# Patient Record
Sex: Male | Born: 1960 | Hispanic: No | Marital: Married | Smoking: Never smoker
Health system: Southern US, Community
[De-identification: ages and names within clinical notes are randomized; demographics above are authoritative.]

## PROBLEM LIST (undated history)

## (undated) DIAGNOSIS — R55 Syncope and collapse: Secondary | ICD-10-CM

## (undated) DIAGNOSIS — R9431 Abnormal electrocardiogram [ECG] [EKG]: Secondary | ICD-10-CM

## (undated) DIAGNOSIS — R4020 Unspecified coma: Secondary | ICD-10-CM

## (undated) HISTORY — PX: ADENOIDECTOMY: SUR15

## (undated) HISTORY — DX: Syncope and collapse: R55

## (undated) HISTORY — DX: Unspecified coma: R40.20

## (undated) HISTORY — DX: Abnormal electrocardiogram (ECG) (EKG): R94.31

---

## 2017-02-19 ENCOUNTER — Emergency Department (HOSPITAL_COMMUNITY): Payer: BLUE CROSS/BLUE SHIELD

## 2017-02-19 ENCOUNTER — Encounter (HOSPITAL_COMMUNITY): Payer: Self-pay | Admitting: *Deleted

## 2017-02-19 ENCOUNTER — Emergency Department (HOSPITAL_COMMUNITY)
Admission: EM | Admit: 2017-02-19 | Discharge: 2017-02-19 | Disposition: A | Discharge status: Home / Self Care | Payer: BLUE CROSS/BLUE SHIELD | Attending: Emergency Medicine | Admitting: Emergency Medicine

## 2017-02-19 DIAGNOSIS — R9431 Abnormal electrocardiogram [ECG] [EKG]: Secondary | ICD-10-CM | POA: Insufficient documentation

## 2017-02-19 DIAGNOSIS — R4020 Unspecified coma: Secondary | ICD-10-CM

## 2017-02-19 DIAGNOSIS — R55 Syncope and collapse: Secondary | ICD-10-CM | POA: Insufficient documentation

## 2017-02-19 HISTORY — DX: Unspecified coma: R40.20

## 2017-02-19 LAB — BASIC METABOLIC PANEL
ANION GAP: 8 (ref 5–15)
BUN: 13 mg/dL (ref 6–20)
CO2: 23 mmol/L (ref 22–32)
Calcium: 9.2 mg/dL (ref 8.9–10.3)
Chloride: 104 mmol/L (ref 101–111)
Creatinine, Ser: 1.05 mg/dL (ref 0.61–1.24)
GFR calc Af Amer: >60 mL/min (ref >60)
GLUCOSE: 96 mg/dL (ref 65–99)
POTASSIUM: 3.9 mmol/L (ref 3.5–5.1)
SODIUM: 135 mmol/L (ref 135–145)

## 2017-02-19 LAB — CBC WITH DIFFERENTIAL/PLATELET
BASOS PCT: 0 %
Basophils Absolute: 0 10*3/uL (ref 0.0–0.1)
Eosinophils Absolute: 0 10*3/uL (ref 0.0–0.7)
Eosinophils Relative: 1 %
HEMATOCRIT: 43.1 % (ref 39.0–52.0)
HEMOGLOBIN: 14.3 g/dL (ref 13.0–17.0)
LYMPHS ABS: 1.2 10*3/uL (ref 0.7–4.0)
LYMPHS PCT: 28 %
MCH: 29.7 pg (ref 26.0–34.0)
MCHC: 33.2 g/dL (ref 30.0–36.0)
MCV: 89.6 fL (ref 78.0–100.0)
MONO ABS: 0.6 10*3/uL (ref 0.1–1.0)
MONOS PCT: 15 %
NEUTROS ABS: 2.3 10*3/uL (ref 1.7–7.7)
NEUTROS PCT: 56 %
Platelets: 169 10*3/uL (ref 150–400)
RBC: 4.81 MIL/uL (ref 4.22–5.81)
RDW: 12.6 % (ref 11.5–15.5)
WBC: 4.1 10*3/uL (ref 4.0–10.5)

## 2017-02-19 LAB — I-STAT TROPONIN, ED
Troponin i, poc: 0 ng/mL (ref 0.00–0.08)
Troponin i, poc: 0 ng/mL (ref 0.00–0.08)

## 2017-02-19 MED ORDER — SODIUM CHLORIDE 0.9 % IV BOLUS (SEPSIS)
1000.0000 mL | Freq: Once | INTRAVENOUS | Status: AC
  Administered 2017-02-19: 1000 mL via INTRAVENOUS

## 2017-02-19 NOTE — ED Notes (Signed)
Gave pt something to eat and drink per doctor.

## 2017-02-19 NOTE — Consult Note (Addendum)
The patient has been seen in conjunction with Robbie Lis, PA-C. All aspects of care have been considered and discussed. The patient has been personally interviewed, examined, and all clinical data has been reviewed.   56 year old with prior history of syncope at least twice in the past who for the preceding 24 hour set suffered a gastrointestinal illness with associated abdominal pain, nausea, and diarrhea. He had not been eating well because of the GI illness. Went to a [employer] today, the room was warm, she became diaphoretic, developed abdominal cramping, and had syncope.  Brought to the emergency room after the EKG was performed which revealed diffuse ST segment elevation.  The ECG personally interpreted personally interpreted reveals mild diffuse ST elevation predominantly in the anterior lateral precordial leads. No reciprocal changes noted. Pattern is most consistent with early repolarization.  Overall, I feel this patient has a history compatible with neurally mediated syncope. He has had more than one occasion. Today's episode seems to be precipitated by a gastrointestinal illness.  Plan is to get a delta troponin and if no evidence of myocardial ischemia, he could be discharged from the emergency room.  We will arrange for him to see electrophysiology team, specifically Dr. Sherryl Manges, to review his history and clinical data hopefully provide guidance that may be helpful for this young man in the future.  I did some teaching concerning response to prodrome which includes assuming a sitting or lying position as soon as possible, leg elevation and possible, and cold compresses to 4 head and neck. Long discussion with both the patient and wife.  Management/workup of the patient's underlying gastrointestinal acute illness is at the discretion of the emergency department physician.  Patient ID: Steven Bender MRN: [id number], DOB/AGE: Jun 13, 1961   Admit date:  02/19/2017  Reason for Consult: Syncope and Abnormal EKG Requesting Physician: Dr. Criss Alvine, Emergency Medicine    Primary Physician: No primary care provider on file. Primary Cardiologist: New (Dr. Katrinka Blazing)   Pt. Profile:  Steven Bender is a 56 y.o. male  who is being seen today, in the ED, for the evaluation of syncope and an abnormal EKG at the request of Dr. Criss Alvine, ED Physician.   Problem List  History reviewed. No pertinent past medical history.  Past Surgical History:  Procedure Laterality Date  . ADENOIDECTOMY       Allergies  Allergies  Allergen Reactions  . Beef-Derived Products Nausea And Vomiting    HPI  Steven Bender is a 56 y.o. male  who is being seen today, in the ED, for the evaluation of syncope and an abnormal EKG at the request of Dr. Criss Alvine, ED Physician.   He is a former resident of [place]. He now resides in [facility] and has not established care with a PCP since his move. He reports he has not been evaluated by a medical provider in over 2 years. He denies any h/o heart disease. No h/o HTN, HLD, DM nor tobacco use. He reports his father had "heart issues" late in life, but cannot recall the exact details.  The patient does report that he underwent w/u in [place] roughly 8 years ago for chest pain but w/u was normal. He had an exercise stress test. He recalls running on the treadmill for over 10 min w/o CP and without abnormal findings.   He also reports being told by a provider in [place] ~ 6 years ago that he had vasovagal syncope. Pt reports that he passed out shortly after injuring  his finger which caused significant pain.   Per pt report, he has had severe diarrhea for the past 2 days. No known sick contacts. He has had associated chills, generalized weakness, fatigue and occasional dizziness. He went to work today and was in a meeting. He reports the conference room was very hot. During the meeting, he started to feel unwell. Nauseated with abdominal cramping. He felt  faint. He was sitting in a chair and lowered his head and had a brief LOC. His coworkers report that he was out for just a couple of seconds. He felt a bit altered initially when he awoke. He denies any associated CP, dyspnea or palpitations. He also denies any recent h/o exertional CP or dyspnea. He is pretty active. He plays in the yard with his children. Yesterday, he was out throwing around a baseball with his son w/o limitation. He also walks and runs for exercise, w/o recent issues.   In ED, CBC is unremarkable. Normal WBC. BMP also unremarkable with normal SCr and K. He is afebrile. CXR is negative. No edema or consolidation. POC troponin is negative. EKG shows normal sinus rhythm w/ abnormal R-wave progression, early transition and mild diffuse ST elevations. However no prior EKG tracings for comparison. He does not recall ever being told about an abnormal EKG in the past. Orthostatics not checked. He is currently asymptomatic. He feels better. He is tolerating food. He just ate a chicken salad sandwich. No nausea. He feels comfortable going home, if cleared by MD.   Home Medications  Prior to Admission medications   Medication Sig Start Date End Date Taking? Authorizing Provider  ibuprofen (ADVIL,MOTRIN) 200 MG tablet Take 200-400 mg by mouth every 6 (six) hours as needed (for pain or inflammation).   Yes Historical Provider, MD     Family History  Family History  Problem Relation Age of Onset  . Heart disease Father     pt cannot recall exact details    Social History  Social History   Social History  . Marital status: Married    Spouse name: N/A  . Number of children: N/A  . Years of education: N/A   Occupational History  . Not on file.   Social History Main Topics  . Smoking status: Never Smoker  . Smokeless tobacco: Never Used  . Alcohol use Yes     Comment: occ  . Drug use: No  . Sexual activity: Not on file   Other Topics Concern  . Not on file   Social  History Narrative  . No narrative on file     Review of Systems General:  No chills, fever, night sweats or weight changes.  Cardiovascular:  No chest pain, dyspnea on exertion, edema, orthopnea, palpitations, paroxysmal nocturnal dyspnea. Dermatological: No rash, lesions/masses Respiratory: No cough, dyspnea Urologic: No hematuria, dysuria Abdominal:   No nausea, vomiting, diarrhea, bright red blood per rectum, melena, or hematemesis Neurologic:  No visual changes, wkns, changes in mental status. All other systems reviewed and are otherwise negative except as noted above.  Physical Exam  Blood pressure 126/73, pulse 82, temperature 98.8 F (37.1 C), temperature source Oral, resp. rate 19, height  (1.778 m), weight 180 lb (81.6 kg), SpO2 96 %.  General: Pleasant, NAD Psych: Normal affect. Neuro: Alert and oriented X 3. Moves all extremities spontaneously. HEENT: Normal  Neck: Supple without bruits or JVD. Lungs:  Resp regular and unlabored, CTA. Heart: RRR no s3, s4, or murmurs. Abdomen: Soft, non-tender,  non-distended, BS + x 4.  Extremities: No clubbing, cyanosis or edema. DP/PT/Radials 2+ and equal bilaterally.  Labs  Troponin [facility] Test)  Recent Labs  02/19/17 1257  TROPIPOC 0.00   No results for input(s): CKTOTAL, CKMB, TROPONINI in the last 72 hours. Lab Results  Component Value Date   WBC 4.1 02/19/2017   HGB 14.3 02/19/2017   HCT 43.1 02/19/2017   MCV 89.6 02/19/2017   PLT 169 02/19/2017     Recent Labs Lab 02/19/17 1245  NA 135  K 3.9  CL 104  CO2 23  BUN 13  CREATININE 1.05  CALCIUM 9.2  GLUCOSE 96   No results found for: CHOL, HDL, LDLCALC, TRIG No results found for: DDIMER   Radiology/Studies  Dg Chest Portable 1 View  Result Date: 02/19/2017 CLINICAL DATA:  Syncope EXAM: PORTABLE CHEST 1 VIEW COMPARISON:  None. FINDINGS: Lungs are clear. Heart size and pulmonary vascularity are within normal limits. No adenopathy. No evident  bone lesions. IMPRESSION: No edema or consolidation. Electronically Signed   By: Bretta Bang III M.D.   On: 02/19/2017 13:04    ECG  Normal sinus rhythm Abnormal R-wave progression, early transition diffuse ST elevations, without reciprocal change suggesting early repolarization versus pericarditis. Injury pattern is less likely No old tracing to compare   -- personally reviewed  Telemetry  NSR -- personally reviewed   ASSESSMENT AND PLAN  1. Syncope + Abnormal EKG: pt with prior h/o vasovagal syncope ~6 years ago, in the setting of severe pain from finger injury. His episode earlier today was in the setting of likely viral GI illness after 2 days of diarrhea. He had recurrent nausea and a bout of abdominal cramping, while sitting in a hot environment (very hot office space), when he briefly fainted at a work meeting. There was no associated CP, dyspnea or palpitations. He has no significant cardiac risk factors. Physical exam is benign w/o cardiac murmurs or carotid bruits. CBC and BMP both unremarkable. H/H normal. SCr/BUN WNL as well as K. His EKG shows slight diffuse ST elevations, however no prior EKGs for comparison. No anginal symptoms. Initial Troponin is negative. Recommend checking a 2nd delta troponin in the ED. If negative, it would be reasonable to d/c home and to continue w/u as an outpatient. We can consider outpatient 2D echo. Dr. Katrinka Blazing to assess and will provide definitive recommendations.    Signed, Robbie Lis, PA-C, MHS 02/19/2017, 4:24 PM [facility] Pager: [phone]

## 2017-02-19 NOTE — ED Provider Notes (Signed)
[facility]-EMERGENCY DEPT Provider Note   CSN: [id number] Arrival date & time: 02/19/17  1233     History   Chief Complaint Chief Complaint  Patient presents with  . Loss of Consciousness    HPI Steven Bender is a 56 y.o. male.  HPI  56 year old male with reportedly no medical problems presents with syncope. Brought in by EMS. Patient states that he has been sick over the last 3 days with diarrhea. He estimates he had 10 episodes yesterday. No blood in the stools. No fevers, chest pain, headache, vomiting. Patient states that he had to stay home from work yesterday due to the amount of diarrhea. Every time he would eat he would have diarrhea. Felt better today so he went to work. While at work he went to a meeting where everyone in the room thought that the heat was on too high. There was no water available. He progressively felt worse and worse and became lightheaded. He felt nauseated and had some peri-umbilical abdominal cramping. He put his head in his hands and was about asked for help when he relies that he had passed out briefly. Coworkers state it was very brief, no exact time given. There was never chest pain or shortness of breath or palpitations before, during, or after. Currently he feels fine, no nausea or abd pain. EMS noted ECG with anterior ST elevations. Patient denies a known coronary history. He states he has an ECG in [place] at [facility], when he had chest pain and a negative stress test. Otherwise no ECGs. No doctor in this area.  History reviewed. No pertinent past medical history.  There are no active problems to display for this patient.   Past Surgical History:  Procedure Laterality Date  . ADENOIDECTOMY         Home Medications    Prior to Admission medications   Medication Sig Start Date End Date Taking? Authorizing Provider  ibuprofen (ADVIL,MOTRIN) 200 MG tablet Take 200-400 mg by mouth every 6 (six) hours as needed (for pain or inflammation).   Yes  Historical Provider, MD    Family History No family history on file.  Social History Social History  Substance Use Topics  . Smoking status: Never Smoker  . Smokeless tobacco: Never Used  . Alcohol use Yes     Comment: occ     Allergies   Beef-derived products   Review of Systems Review of Systems  Constitutional: Negative for fever.  Respiratory: Negative for shortness of breath.   Cardiovascular: Negative for chest pain and palpitations.  Gastrointestinal: Positive for abdominal pain and nausea. Negative for vomiting.  Neurological: Positive for syncope and light-headedness.  All other systems reviewed and are negative.    Physical Exam Updated Vital Signs BP 126/73   Pulse 82   Temp 98.8 F (37.1 C) (Oral)   Resp 19   Ht  (1.778 m)   Wt 180 lb (81.6 kg)   SpO2 96%   BMI 25.83 kg/m   Physical Exam  Constitutional: He is oriented to person, place, and time. He appears well-developed and well-nourished. No distress.  HENT:  Head: Normocephalic and atraumatic.  Right Ear: External ear normal.  Left Ear: External ear normal.  Nose: Nose normal.  Eyes: Right eye exhibits no discharge. Left eye exhibits no discharge.  Neck: Neck supple.  Cardiovascular: Normal rate, regular rhythm and normal heart sounds.   Pulmonary/Chest: Effort normal and breath sounds normal.  Abdominal: Soft. There is no tenderness.  Musculoskeletal: He exhibits no edema.  Neurological: He is alert and oriented to person, place, and time.  Skin: Skin is warm and dry. He is not diaphoretic.  Nursing note and vitals reviewed.    ED Treatments / Results  Labs (all labs ordered are listed, but only abnormal results are displayed) Labs Reviewed  BASIC METABOLIC PANEL  CBC WITH DIFFERENTIAL/PLATELET  Rosezena Sensor, ED  I-STAT TROPOININ, ED    EKG  EKG Interpretation  Date/Time:  Wednesday February 19 2017 12:36:33 EDT Ventricular Rate:  73 PR Interval:    QRS  Duration: 95 QT Interval:  376 QTC Calculation: 415 R Axis:   65 Text Interpretation:  Normal sinus rhythm Abnormal R-wave progression, early transition diffuse ST elevations No old tracing to compare Confirmed by Fusako Tanabe MD, Cimone Fahey [phone]) on 02/19/2017 12:47:17 PM Also confirmed by Criss Alvine MD, Fantasha Daniele [phone]), editor [place] [address], [place] [phone])  on 02/19/2017 1:14:15 PM       EKG Interpretation  Date/Time:  Wednesday February 19 2017 13:26:37 EDT Ventricular Rate:  72 PR Interval:    QRS Duration: 99 QT Interval:  394 QTC Calculation: 432 R Axis:   64 Text Interpretation:  Sinus rhythm Minimal ST elevation, anterior leads ST elevation unchanged compared to earlier in the day, likely early repol Confirmed by Cong Hightower MD, Tywanna Seifer [phone]) on 02/19/2017 2:03:55 PM        Radiology Dg Chest Portable 1 View  Result Date: 02/19/2017 CLINICAL DATA:  Syncope EXAM: PORTABLE CHEST 1 VIEW COMPARISON:  None. FINDINGS: Lungs are clear. Heart size and pulmonary vascularity are within normal limits. No adenopathy. No evident bone lesions. IMPRESSION: No edema or consolidation. Electronically Signed   By: Bretta Bang III M.D.   On: 02/19/2017 13:04    Procedures Procedures (including critical care time)  Medications Ordered in ED Medications  sodium chloride 0.9 % bolus 1,000 mL (1,000 mLs Intravenous New Bag/Given 02/19/17 1248)     Initial Impression / Assessment and Plan / ED Course  I have reviewed the triage vital signs and the nursing notes.  Pertinent labs & imaging results that were available during my care of the patient were reviewed by me and considered in my medical decision making (see chart for details).  Clinical Course as of Feb 20 1543  Wed Feb 19, 2017  1245 I think this ST elevation is not ischemic, likely early repol. No CP/dyspnea symptoms. Fluids, consult cardiology, will try to get old ECG from [facility]  [SG]  1252 Dr Tresa Endo reviewed ECG. Feels this is most likely early repol.  Advises repeat ECG to make sure no progression. If no change, no further acute cardiac workup.  [SG]    Clinical Course User Index [SG] Pricilla Loveless, MD    Patient has remained stable. Discussed with patient/wife, will do 2nd troponin but my suspicion of ACS/STEMI is very low, especially with no other symptoms. This seems c/w syncope, especially with hot room and recent diarrheal illness. No complaints. Care to Dr. Rosalia Hammers with 2nd troponin pending  Final Clinical Impressions(s) / ED Diagnoses   Final diagnoses:  Neurocardiogenic syncope    New Prescriptions New Prescriptions   No medications on file     Pricilla Loveless, MD 02/19/17 1545

## 2017-02-19 NOTE — ED Provider Notes (Signed)
56 year old man with episode of syncope thought to be secondary to volume depletion. Patient had an abnormal EKG here. Troponin and repeat troponin are normal. Patient initially seen by Dr. Criss Alvine. Plan to discharge if troponin is normal. Cardiology called and consulted and arranged follow-up for patient. Repeat troponin is normal. Patient is stable for discharge.   Margarita Grizzle, MD 02/19/17 [phone]

## 2017-02-19 NOTE — ED Triage Notes (Signed)
Pt here via [facility] after syncopal episode of several seconds while sitting at a meeting.  EKG on scene showed st elevations.  Pt states diarrhea x several days and states room was hot.  He felt light-headed and passed out.  Given 324 asa and 4 zofran en-route.

## 2017-02-21 ENCOUNTER — Encounter: Payer: Self-pay | Admitting: Internal Medicine

## 2017-10-23 IMAGING — DX DG CHEST 1V PORT
1 series · 1 of 1 positions shown · non-contrast
Comparison: None.

CLINICAL DATA: Syncope

EXAM:
PORTABLE CHEST 1 VIEW

[chest ap]
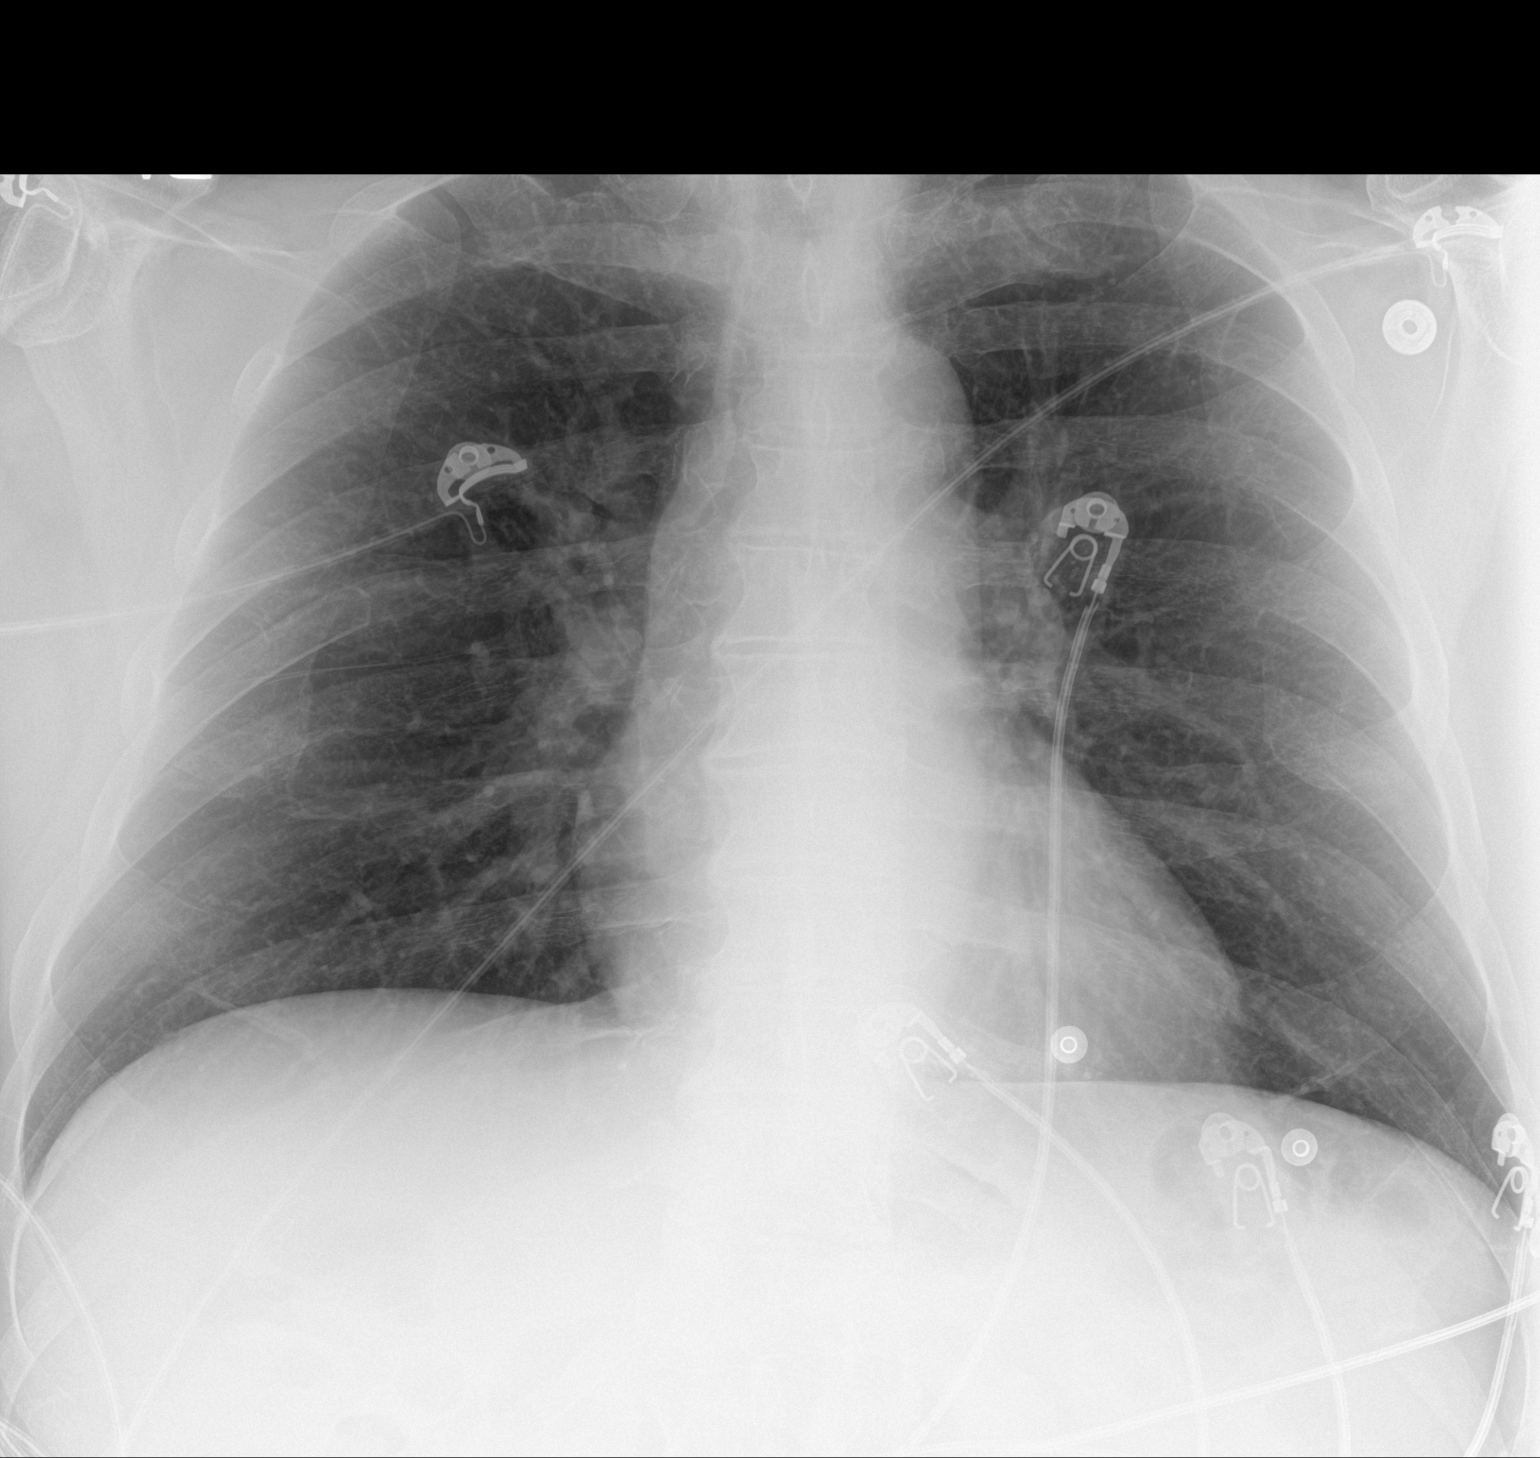

[1 of 1 positions shown; findings below may reference images not displayed]

FINDINGS: Lungs are clear. Heart size and pulmonary vascularity are within
normal limits. No adenopathy. No evident bone lesions.
IMPRESSION: No edema or consolidation.

## 2017-12-12 ENCOUNTER — Encounter: Payer: Self-pay | Admitting: Cardiology

## 2017-12-12 ENCOUNTER — Encounter (INDEPENDENT_AMBULATORY_CARE_PROVIDER_SITE_OTHER): Payer: Self-pay

## 2017-12-12 ENCOUNTER — Ambulatory Visit (INDEPENDENT_AMBULATORY_CARE_PROVIDER_SITE_OTHER): Payer: Commercial Managed Care - PPO | Admitting: Cardiology

## 2017-12-12 VITALS — BP 116/70 | HR 73 | Ht 70.0 in | Wt 191.0 lb

## 2017-12-12 DIAGNOSIS — R55 Syncope and collapse: Secondary | ICD-10-CM

## 2017-12-12 NOTE — Patient Instructions (Signed)
Medication Instructions:  Your physician recommends that you continue on your current medications as directed. Please refer to the Current Medication list given to you today.  * If you need a refill on your cardiac medications before your next appointment, please call your pharmacy.   Labwork: None ordered  Testing/Procedures: None ordered  Follow-Up: No follow up is needed at this time with Dr. Camnitz.  He will see you on an as needed basis.   Thank you for choosing [facility]!!   [name], RN [phone]     

## 2017-12-12 NOTE — Progress Notes (Signed)
Electrophysiology Office Note   Date:  12/12/2017   ID:  Steven Bender, DOB May 29, 1961, MRN [id number]  PCP:  Patient, No Pcp Per  Cardiologist:   Primary Electrophysiologist:  Will Jorja LoaMartin Camnitz, MD    Chief Complaint  Patient presents with  . Advice Only    Syncope     History of Present Illness: Steven Bender is a 57 y.o. male who is being seen today for the evaluation of syncope at the request of Pricilla LovelessScott Goldston. Presenting today for electrophysiology evaluation.  He presented to the emergency room in April 2018.  He is following up with cardiology for that visit.  He had diarrhea 2 days prior to the episode of syncope.  He was in a meeting when he felt unwell.  He was nauseous with abdominal cramping.  He also felt faint.  He had a brief loss of consciousness, per coworkers, for a few seconds.  He was altered initially when he awoke.  He had no chest pain, dyspnea, or palpitations.  His EKG showed sinus rhythm with abnormal R wave progression and mild diffuse ST elevations.  It was thought that he had an early repolarization pattern.  He does have a history of vasovagal syncope.  He is passed out or been near syncopal around the time of injuries.  Otherwise he has done well since his emergency room visit.  He has not passed out since that time.  Today, he denies symptoms of palpitations, chest pain, shortness of breath, orthopnea, PND, lower extremity edema, claudication, dizziness, presyncope, syncope, bleeding, or neurologic sequela. The patient is tolerating medications without difficulties.    Past Medical History:  Diagnosis Date  . Abnormal EKG   . Loss of consciousness (HCC) 02/19/2017  . Syncope and collapse    02/19/2017   Past Surgical History:  Procedure Laterality Date  . ADENOIDECTOMY       Current Outpatient Medications  Medication Sig Dispense Refill  . ibuprofen (ADVIL,MOTRIN) 200 MG tablet Take 200-400 mg by mouth every 6 (six) hours as needed (for pain  or inflammation).     No current facility-administered medications for this visit.     Allergies:   Beef-derived products   Social History:  The patient  reports that  has never smoked. he has never used smokeless tobacco. He reports that he drinks alcohol. He reports that he does not use drugs.   Family History:  The patient's family history includes Depression in his brother; Diabetes in his brother, brother, and sister; Heart disease in his brother and father; Hyperlipidemia in his mother; Stroke in his mother.    ROS:  Please see the history of present illness.   Otherwise, review of systems is positive for anxiety.   All other systems are reviewed and negative.    PHYSICAL EXAM: VS:  BP 116/70   Pulse 73   Ht 5\' 10"  (1.778 m)   Wt 191 lb (86.6 kg)   BMI 27.41 kg/m  , BMI Body mass index is 27.41 kg/m. GEN: Well nourished, well developed, in no acute distress  HEENT: normal  Neck: no JVD, carotid bruits, or masses Cardiac: RRR; no murmurs, rubs, or gallops,no edema  Respiratory:  clear to auscultation bilaterally, normal work of breathing GI: soft, nontender, nondistended, + BS MS: no deformity or atrophy  Skin: warm and dry Neuro:  Strength and sensation are intact Psych: euthymic mood, full affect  EKG:  EKG is ordered today. Personal review of the ekg ordered shows sinus  rhythm, early repolarization, rate 73  Recent Labs: 02/19/2017: BUN 13; Creatinine, Ser 1.05; Hemoglobin 14.3; Platelets 169; Potassium 3.9; Sodium 135    Lipid Panel  No results found for: CHOL, TRIG, HDL, CHOLHDL, VLDL, LDLCALC, LDLDIRECT   Wt Readings from Last 3 Encounters:  12/12/17 191 lb (86.6 kg)  02/19/17 180 lb (81.6 kg)      Other studies Reviewed: Additional studies/ records that were reviewed today include: Epic notes   ASSESSMENT AND PLAN:  1.  Syncope: Initially thought due to neurally mediated syncope.  Has had no further episodes since his initial syncope in April.  Had  been sick at the time.  It is possible this was a neurocardiogenic or vaguely mediated syncope episode.  As he has not had further episodes, no further workup is indicated.  He does have a history of vasovagal syncope, having passed out multiple times or been near syncopal with injuries.  It is possible that this was simply vasovagal.    Current medicines are reviewed at length with the patient today.   The patient does not have concerns regarding his medicines.  The following changes were made today:  none  Labs/ tests ordered today include:  Orders Placed This Encounter  Procedures  . EKG 12-Lead     Disposition:   FU with Will Camnitz as needed  Signed, Will Jorja Loa, MD  12/12/2017 10:16 AM     [facility] [address] [place] [address] [phone] (office) [phone] (fax)

## 2019-08-27 ENCOUNTER — Other Ambulatory Visit: Payer: Self-pay

## 2019-08-27 DIAGNOSIS — Z20822 Contact with and (suspected) exposure to covid-19: Secondary | ICD-10-CM

## 2019-08-28 LAB — NOVEL CORONAVIRUS, NAA: SARS-CoV-2, NAA: NOT DETECTED

## 2020-02-28 ENCOUNTER — Ambulatory Visit: Payer: Self-pay

## 2020-10-20 ENCOUNTER — Ambulatory Visit: Payer: Self-pay | Attending: Internal Medicine

## 2020-10-20 DIAGNOSIS — Z23 Encounter for immunization: Secondary | ICD-10-CM

## 2020-10-20 NOTE — Progress Notes (Signed)
° °  Covid-19 Vaccination Clinic  Name:  Steven Bender    MRN: [id number] DOB: 1961-07-06  10/20/2020  Mr. Ibarra was observed post Covid-19 immunization for 15 minutes without incident. He was provided with Vaccine Information Sheet and instruction to access the V-Safe system.   Mr. Sainsbury was instructed to call 911 with any severe reactions post vaccine:  Difficulty breathing   Swelling of face and throat   A fast heartbeat   A bad rash all over body   Dizziness and weakness   Immunizations Administered    No immunizations on file.

## 2021-09-27 ENCOUNTER — Ambulatory Visit: Payer: Self-pay | Attending: Internal Medicine

## 2021-09-27 ENCOUNTER — Other Ambulatory Visit (HOSPITAL_BASED_OUTPATIENT_CLINIC_OR_DEPARTMENT_OTHER): Payer: Self-pay

## 2021-09-27 DIAGNOSIS — Z23 Encounter for immunization: Secondary | ICD-10-CM

## 2021-09-27 MED ORDER — MODERNA COVID-19 BIVAL BOOSTER 50 MCG/0.5ML IM SUSP
INTRAMUSCULAR | 0 refills | Status: DC
  Filled 2021-09-27: qty 0.5, 1d supply, fill #0

## 2021-09-27 NOTE — Progress Notes (Signed)
   Covid-19 Vaccination Clinic  Name:  Steven Bender    MRN: [id number] DOB: 1961-02-03  09/27/2021  Mr. Haub was observed post Covid-19 immunization for 15 minutes without incident. He was provided with Vaccine Information Sheet and instruction to access the V-Safe system.   Mr. Knerr was instructed to call 911 with any severe reactions post vaccine: Difficulty breathing  Swelling of face and throat  A fast heartbeat  A bad rash all over body  Dizziness and weakness   Immunizations Administered     Name Date Dose VIS Date Route   Moderna Covid-19 vaccine Bivalent Booster 09/27/2021 10:40 AM 0.5 mL 06/30/2021 Intramuscular   Manufacturer: Moderna   Lot: [id number]   NDC: 03212-248-25

## 2022-06-19 ENCOUNTER — Ambulatory Visit (INDEPENDENT_AMBULATORY_CARE_PROVIDER_SITE_OTHER): Payer: Managed Care, Other (non HMO)

## 2022-06-19 ENCOUNTER — Ambulatory Visit (INDEPENDENT_AMBULATORY_CARE_PROVIDER_SITE_OTHER): Payer: Managed Care, Other (non HMO) | Admitting: Family Medicine

## 2022-06-19 DIAGNOSIS — L720 Epidermal cyst: Secondary | ICD-10-CM | POA: Insufficient documentation

## 2022-06-19 DIAGNOSIS — M25552 Pain in left hip: Secondary | ICD-10-CM

## 2022-06-19 DIAGNOSIS — R3915 Urgency of urination: Secondary | ICD-10-CM

## 2022-06-19 DIAGNOSIS — Z0000 Encounter for general adult medical examination without abnormal findings: Secondary | ICD-10-CM

## 2022-06-19 DIAGNOSIS — Z1211 Encounter for screening for malignant neoplasm of colon: Secondary | ICD-10-CM | POA: Diagnosis not present

## 2022-06-19 DIAGNOSIS — Z125 Encounter for screening for malignant neoplasm of prostate: Secondary | ICD-10-CM

## 2022-06-19 DIAGNOSIS — M25551 Pain in right hip: Secondary | ICD-10-CM

## 2022-06-19 DIAGNOSIS — R3129 Other microscopic hematuria: Secondary | ICD-10-CM

## 2022-06-19 LAB — POCT URINALYSIS DIPSTICK
Bilirubin, UA: NEGATIVE
Glucose, UA: NEGATIVE
Nitrite, UA: NEGATIVE
Protein, UA: NEGATIVE
Spec Grav, UA: 1.025 (ref 1.010–1.025)
Urobilinogen, UA: 0.2 E.U./dL
pH, UA: 5.5 (ref 5.0–8.0)

## 2022-06-19 NOTE — Patient Instructions (Signed)
  Medication Instructions:  Your physician recommends that you continue on your current medications as directed. Please refer to the Current Medication list given to you today. --If you need a refill on any your medications before your next appointment, please call your pharmacy first. If no refills are authorized on file call the office.-- Lab Work: Your physician has recommended that you have lab work today: Yes If you have labs (blood work) drawn today and your tests are completely normal, you will receive your results via MyChart message OR a phone call from our staff.  Please ensure you check your voicemail in the event that you authorized detailed messages to be left on a delegated number. If you have any lab test that is abnormal or we need to change your treatment, we will call you to review the results.  Referrals/Procedures/Imaging: X-Rays (Hip)  Follow-Up: Your next appointment:   Your physician recommends that you schedule a follow-up appointment in: 3-6 weeks cpe with Dr. de Peru.  You will receive a text message or e-mail with a link to a survey about your care and experience with [place] today! We would greatly appreciate your feedback!   Thanks for letting us be apart of your health journey!!  Primary Care and Sports Medicine   Dr. Ceasar Mons Peru   We encourage you to activate your patient portal called "MyChart".  Sign up information is provided on this After Visit Summary.  MyChart is used to connect with patients for Virtual Visits (Telemedicine).  Patients are able to view lab/test results, encounter notes, upcoming appointments, etc.  Non-urgent messages can be sent to your provider as well. To learn more about what you can do with MyChart, please visit --  [email or web address].

## 2022-06-19 NOTE — Progress Notes (Signed)
New Patient Office Visit  Subjective    Patient ID: Steven Bender, male    DOB: 07-24-1961  Age: 61 y.o. MRN: [id number]  CC:  Chief Complaint  Patient presents with   New Patient (Initial Visit)    Pt here to establish new care     HPI Steven Bender presents to establish care Last PCP - in [place], last visit was several years ago  Lump near breast - noted first about 3-4 months ago. Possibly slight increase in size since first noted. No drainage. No redness or pain.  Urinary urgency/intermittent discomfort near testicle/lower abdomen. Urgency has been present for about a couple months. Reports about once nightly awakening to urinate. No pain or burning with urination.  Reports that with his prior PCP, he was having intermittent prostate screening, had PSA completed in the past, never had any abnormal findings related to this.  Needs colon cancer screening -requesting to have this completed.  He does report bilateral hip pain, this has been a chronic issue for patient.  Has not had any prior evaluation or treatment done.  Pain does seem to affect both hips about equally.  Worsens with certain activities.  Patient is originally from [place] (was [employer]). Moved to the area in 2016. Patient works as Hydrographic surveyor, Herbalist. Outside of work, he plays tennis, golf, walking, spending time with family.  Outpatient Encounter Medications as of 06/19/2022  Medication Sig   [DISCONTINUED] COVID-19 mRNA bivalent vaccine, Moderna, (MODERNA COVID-19 BIVAL BOOSTER) 50 MCG/0.5ML injection Inject into the muscle.   [DISCONTINUED] ibuprofen (ADVIL,MOTRIN) 200 MG tablet Take 200-400 mg by mouth every 6 (six) hours as needed (for pain or inflammation).   No facility-administered encounter medications on file as of 06/19/2022.    Past Medical History:  Diagnosis Date   Abnormal EKG    Loss of consciousness (HCC) 02/19/2017   Syncope and collapse     02/19/2017    Past Surgical History:  Procedure Laterality Date   ADENOIDECTOMY      Family History  Problem Relation Age of Onset   Heart disease Father        pt cannot recall exact details   Hyperlipidemia Mother    Stroke Mother    Depression Brother    Heart disease Brother    Diabetes Brother    Diabetes Sister    Diabetes Brother     Social History   Socioeconomic History   Marital status: Married    Spouse name: Not on file   Number of children: Not on file   Years of education: Not on file   Highest education level: Not on file  Occupational History   Not on file  Tobacco Use   Smoking status: Never   Smokeless tobacco: Never  Substance and Sexual Activity   Alcohol use: Yes    Comment: occ   Drug use: No   Sexual activity: Not on file  Other Topics Concern   Not on file  Social History Narrative   Not on file   Social Determinants of Health   Financial Resource Strain: Not on file  Food Insecurity: Not on file  Transportation Needs: Not on file  Physical Activity: Not on file  Stress: Not on file  Social Connections: Not on file  Intimate Partner Violence: Not on file    Objective    BP (!) 152/97   Pulse 75   Ht 5\' 10"  (1.778 m)  Wt 194 lb 8 oz (88.2 kg)   SpO2 97%   BMI 27.91 kg/m   Physical Exam  61 year old male in no acute distress Cardiovascular exam with regular rate and rhythm, no murmur appreciated Lungs clear to auscultation bilaterally Abdomen with normal bowel sounds, mild tenderness to palpation in lower abdomen, no organomegaly, no rebound tenderness On exam of chest wall, there is a small nodule palpated.  There also appears to be a small keratin plug overlying this palpable area.  There is no tenderness to palpation, no surrounding erythema.  Assessment & Plan:   Problem List Items Addressed This Visit       Other   Urinary urgency    Uncertain etiology, could be related to BPH.  Has had prior prostate cancer  screening in the past, this has reportedly been unremarkable.  Last evaluation however was at least 7 to 8 years ago Today, we will proceed with initial labs, will also complete UA.  If UA with concern for possible infection, likely send for culture.  If blood detected, would anticipate referral to urology for further evaluation We will also check PSA which patient reports has been checked in the past and has been within normal limits previously did discuss limitations related to PSA in regards to prostate cancer screening specifically as it can be elevated due to other causes such as BPH      Relevant Orders   POCT urinalysis dipstick (Completed)   Urine Culture   Urinalysis, microscopic only   Bilateral hip pain    Chronic issue for patient, has not had prior evaluation completed Given chronicity of symptoms, will proceed with initial x-rays of bilateral hips.  Will also further assess at follow-up visit.  If symptoms felt to be primarily related to underlying osteoarthritis, considerations are for ultrasound-guided steroid injection, physical therapy, home exercises.      Relevant Orders   DG Hip Unilat W OR W/O Pelvis 2-3 Views Left (Completed)   DG Hip Unilat W OR W/O Pelvis 2-3 Views Right (Completed)   Epidermoid cyst    Area at inferior chest wall appears most consistent with an epidermoid cyst.  There is a small keratin plug overlying this area.  No concerning findings on exam, no concerns based on history.  Discussed possible interventions including excision of cyst, patient would prefer to avoid this for now.  Discussed that this can be considered in the future.  Can continue monitoring for any notable changes such as signs of irritation or inflammation or any changes in size of the lesion      Other Visit Diagnoses     Wellness examination       Relevant Orders   CBC with Differential/Platelet   Comprehensive metabolic panel   Hemoglobin A1c   Lipid panel   TSH Rfx on  Abnormal to Free T4   PSA Total (Reflex To Free)   Prostate cancer screening       Relevant Orders   PSA Total (Reflex To Free)   Colon cancer screening       Relevant Orders   Cologuard   Other microscopic hematuria       Relevant Orders   Urinalysis, microscopic only       Return in about 4 weeks (around 07/17/2022) for CPE.  Likely further discuss hips at that time  Lucynda Rosano J De [place], MD

## 2022-06-19 NOTE — Assessment & Plan Note (Addendum)
Uncertain etiology, could be related to BPH.  Has had prior prostate cancer screening in the past, this has reportedly been unremarkable.  Last evaluation however was at least 7 to 8 years ago Today, we will proceed with initial labs, will also complete UA.  If UA with concern for possible infection, likely send for culture.  If blood detected, would anticipate referral to urology for further evaluation We will also check PSA which patient reports has been checked in the past and has been within normal limits previously did discuss limitations related to PSA in regards to prostate cancer screening specifically as it can be elevated due to other causes such as BPH

## 2022-06-19 NOTE — Assessment & Plan Note (Signed)
Chronic issue for patient, has not had prior evaluation completed Given chronicity of symptoms, will proceed with initial x-rays of bilateral hips.  Will also further assess at follow-up visit.  If symptoms felt to be primarily related to underlying osteoarthritis, considerations are for ultrasound-guided steroid injection, physical therapy, home exercises.

## 2022-06-19 NOTE — Assessment & Plan Note (Signed)
Area at inferior chest wall appears most consistent with an epidermoid cyst.  There is a small keratin plug overlying this area.  No concerning findings on exam, no concerns based on history.  Discussed possible interventions including excision of cyst, patient would prefer to avoid this for now.  Discussed that this can be considered in the future.  Can continue monitoring for any notable changes such as signs of irritation or inflammation or any changes in size of the lesion

## 2022-06-20 LAB — LIPID PANEL
Chol/HDL Ratio: 5.1 ratio — ABNORMAL HIGH (ref 0.0–5.0)
Cholesterol, Total: 242 mg/dL — ABNORMAL HIGH (ref 100–199)
HDL: 47 mg/dL (ref >39)
LDL Chol Calc (NIH): 173 mg/dL — ABNORMAL HIGH (ref 0–99)
Triglycerides: 124 mg/dL (ref 0–149)
VLDL Cholesterol Cal: 22 mg/dL (ref 5–40)

## 2022-06-20 LAB — COMPREHENSIVE METABOLIC PANEL
ALT: 14 IU/L (ref 0–44)
AST: 19 IU/L (ref 0–40)
Albumin/Globulin Ratio: 1.6 (ref 1.2–2.2)
Albumin: 4.5 g/dL (ref 3.8–4.9)
Alkaline Phosphatase: 106 IU/L (ref 44–121)
BUN/Creatinine Ratio: 12 (ref 10–24)
BUN: 11 mg/dL (ref 8–27)
Bilirubin Total: 0.7 mg/dL (ref 0.0–1.2)
CO2: 22 mmol/L (ref 20–29)
Calcium: 10.3 mg/dL — ABNORMAL HIGH (ref 8.6–10.2)
Chloride: 101 mmol/L (ref 96–106)
Creatinine, Ser: 0.91 mg/dL (ref 0.76–1.27)
Globulin, Total: 2.9 g/dL (ref 1.5–4.5)
Glucose: 85 mg/dL (ref 70–99)
Potassium: 4.9 mmol/L (ref 3.5–5.2)
Sodium: 142 mmol/L (ref 134–144)
Total Protein: 7.4 g/dL (ref 6.0–8.5)
eGFR: 96 mL/min/{1.73_m2} (ref >59)

## 2022-06-20 LAB — CBC WITH DIFFERENTIAL/PLATELET
Basophils Absolute: 0.1 10*3/uL (ref 0.0–0.2)
Basos: 1 %
EOS (ABSOLUTE): 0.2 10*3/uL (ref 0.0–0.4)
Eos: 2 %
Hematocrit: 45.8 % (ref 37.5–51.0)
Hemoglobin: 15.8 g/dL (ref 13.0–17.7)
Immature Grans (Abs): 0 10*3/uL (ref 0.0–0.1)
Immature Granulocytes: 0 %
Lymphocytes Absolute: 1.6 10*3/uL (ref 0.7–3.1)
Lymphs: 24 %
MCH: 30.7 pg (ref 26.6–33.0)
MCHC: 34.5 g/dL (ref 31.5–35.7)
MCV: 89 fL (ref 79–97)
Monocytes Absolute: 0.6 10*3/uL (ref 0.1–0.9)
Monocytes: 8 %
Neutrophils Absolute: 4.5 10*3/uL (ref 1.4–7.0)
Neutrophils: 65 %
Platelets: 314 10*3/uL (ref 150–450)
RBC: 5.14 x10E6/uL (ref 4.14–5.80)
RDW: 11.7 % (ref 11.6–15.4)
WBC: 6.9 10*3/uL (ref 3.4–10.8)

## 2022-06-20 LAB — PSA TOTAL (REFLEX TO FREE): Prostate Specific Ag, Serum: 0.4 ng/mL (ref 0.0–4.0)

## 2022-06-20 LAB — HEMOGLOBIN A1C
Est. average glucose Bld gHb Est-mCnc: 111 mg/dL
Hgb A1c MFr Bld: 5.5 % (ref 4.8–5.6)

## 2022-06-20 LAB — TSH RFX ON ABNORMAL TO FREE T4: TSH: 1.94 u[IU]/mL (ref 0.450–4.500)

## 2022-06-21 LAB — URINE CULTURE

## 2022-07-11 ENCOUNTER — Encounter (HOSPITAL_BASED_OUTPATIENT_CLINIC_OR_DEPARTMENT_OTHER): Payer: Self-pay

## 2022-07-12 LAB — COLOGUARD: COLOGUARD: NEGATIVE

## 2022-07-25 ENCOUNTER — Encounter (HOSPITAL_BASED_OUTPATIENT_CLINIC_OR_DEPARTMENT_OTHER): Payer: Managed Care, Other (non HMO) | Admitting: Family Medicine

## 2022-11-08 ENCOUNTER — Other Ambulatory Visit (HOSPITAL_BASED_OUTPATIENT_CLINIC_OR_DEPARTMENT_OTHER): Payer: Self-pay

## 2022-11-08 MED ORDER — COMIRNATY 30 MCG/0.3ML IM SUSY
PREFILLED_SYRINGE | INTRAMUSCULAR | 0 refills | Status: DC
  Filled 2022-11-08: qty 0.3, 1d supply, fill #0

## 2022-12-16 ENCOUNTER — Encounter (HOSPITAL_BASED_OUTPATIENT_CLINIC_OR_DEPARTMENT_OTHER): Payer: Self-pay | Admitting: Family Medicine

## 2022-12-16 ENCOUNTER — Other Ambulatory Visit (HOSPITAL_BASED_OUTPATIENT_CLINIC_OR_DEPARTMENT_OTHER): Payer: Self-pay

## 2022-12-16 ENCOUNTER — Ambulatory Visit (INDEPENDENT_AMBULATORY_CARE_PROVIDER_SITE_OTHER): Payer: Managed Care, Other (non HMO) | Admitting: Family Medicine

## 2022-12-16 VITALS — BP 159/85 | HR 68 | Ht 70.0 in | Wt 203.7 lb

## 2022-12-16 DIAGNOSIS — L0291 Cutaneous abscess, unspecified: Secondary | ICD-10-CM

## 2022-12-16 MED ORDER — CEPHALEXIN 500 MG PO CAPS: 500.0000 mg | ORAL_CAPSULE | Freq: Two times a day (BID) | ORAL | 0 refills | Status: DC

## 2022-12-16 MED ORDER — SHINGRIX 50 MCG/0.5ML IM SUSR
INTRAMUSCULAR | 0 refills | Status: DC
  Filled 2022-12-16: qty 0.5, 1d supply, fill #0

## 2022-12-16 NOTE — Progress Notes (Signed)
   Established Patient Office Visit  Subjective   Patient ID: Steven Bender, male    DOB: 1961-10-13  Age: 62 y.o. MRN: [id number]  Chief Complaint  Patient presents with   Mass    Pt here for having a bump on his chest he stated, he noticed it a couple of months ago, started getting irritated and swelling up about 10 days ago, pus and blood     HPI Scabbed area under left breast, reports recent drainage of pus mixed with serous fluid after spontaneous rupture several days ago. Area has been  present for 6 months, worse now--  getting larger this past week. No fever or chills.  Reviewed picture patient had before spontaneously drained.  Reports applying warm compresses after drainage started.   Review of Systems  Constitutional:  Negative for chills and fever.  Respiratory:  Negative for shortness of breath.   Cardiovascular:  Negative for chest pain.  Gastrointestinal:  Negative for nausea and vomiting.  Skin:        Scabbed area under left breast.       Objective:     BP (!) 159/85 (BP Location: Right Arm, Patient Position: Sitting, Cuff Size: Normal)   Pulse 68   Ht 5\' 10"  (1.778 m)   Wt 203 lb 11.2 oz (92.4 kg)   SpO2 99%   BMI 29.23 kg/m    Physical Exam Vitals and nursing note reviewed.  Constitutional:      General: He is not in acute distress.    Appearance: Normal appearance. He is normal weight.  Cardiovascular:     Rate and Rhythm: Normal rate.  Pulmonary:     Effort: Pulmonary effort is normal.  Skin:    General: Skin is warm and dry.          Comments: 4 cm x 3 cm area scabbed under left breast with surrounding erythema. Indurated, not appropriate for further I&D today.   Neurological:     General: No focal deficit present.     Mental Status: He is alert. Mental status is at baseline.  Psychiatric:        Mood and Affect: Mood normal.        Behavior: Behavior normal.        Thought Content: Thought content normal.        Judgment: Judgment  normal.      No results found for any visits on 12/16/22.    The 10-year ASCVD risk score (Arnett DK, et al., 2019) is: 16.4%    Assessment & Plan:   Problem List Items Addressed This Visit     Abscess - Primary    Indurated area under left breast that spontaneously ruptured several days ago.  This area is scabbed with erythema surrounding the scab.  Reports spontaneous rupture with pus mixed with serous fluid.  Denies fever, chills.  Has been using warm compresses after spontaneous rupture. No indication for I&D today.  Will treat with Keflex 500 mg twice daily for 10 days.  He will follow-up if symptoms do not resolve with treatment for possible derm referral for definitve treatment.       Relevant Medications   cephALEXin (KEFLEX) 500 MG capsule  Blood pressure is elevated in office today, recommend home monitoring to ensure return to normal.  Agrees with plan of care discussed.  Questions answered.   Return if symptoms worsen or fail to improve, for abscess.   Chalmers Guest, FNP

## 2022-12-16 NOTE — Assessment & Plan Note (Addendum)
Indurated area under left breast that spontaneously ruptured several days ago.  This area is scabbed with erythema surrounding the scab.  Reports spontaneous rupture with pus mixed with serous fluid.  Denies fever, chills.  Has been using warm compresses after spontaneous rupture. No indication for I&D today.  Will treat with Keflex 500 mg twice daily for 10 days.  Steven Bender will follow-up if symptoms do not resolve with treatment for possible derm referral for definitve treatment.

## 2023-01-29 ENCOUNTER — Encounter (HOSPITAL_BASED_OUTPATIENT_CLINIC_OR_DEPARTMENT_OTHER): Payer: Self-pay

## 2024-04-19 NOTE — Progress Notes (Signed)
**Note Steven-Identified via Obfuscation**  [facility] [address] [place] [address] Phone: [phone] Subjective:   IBryan Bender, am serving as a scribe for Dr. Ronnell Coins.  I'm seeing this patient by the request  of:  Steven Peru, Alonza Jansky, MD  CC: left foot pain   Steven Bender  Steven Bender is a 63 y.o. male coming in with complaint of L foot pain. Patient states was playing tennis and experience pain out of no where. Pain is over lateral side of foot. Has been icing. Has nerve issue on 3rd and 4th toe.    Foot and ankle xray first please  Past Medical History:  Diagnosis Date   Abnormal EKG    Loss of consciousness (HCC) 02/19/2017   Syncope and collapse    02/19/2017   Past Surgical History:  Procedure Laterality Date   ADENOIDECTOMY     Social History   Socioeconomic History   Marital status: Married    Spouse name: Not on file   Number of children: Not on file   Years of education: Not on file   Highest education level: Not on file  Occupational History   Not on file  Tobacco Use   Smoking status: Never   Smokeless tobacco: Never  Substance and Sexual Activity   Alcohol use: Yes    Comment: occ   Drug use: No   Sexual activity: Not on file  Other Topics Concern   Not on file  Social History Narrative   Not on file   Social Drivers of Health   Financial Resource Strain: Not on file  Food Insecurity: Not on file  Transportation Needs: Not on file  Physical Activity: Not on file  Stress: Not on file  Social Connections: Not on file   Allergies  Allergen Reactions   Beef-Derived Drug Products Nausea And Vomiting   Family History  Problem Relation Age of Onset   Heart disease Father        pt cannot recall exact details   Hyperlipidemia Mother    Stroke Mother    Depression Brother    Heart disease Brother    Diabetes Brother    Diabetes Sister    Diabetes Brother      Current Outpatient Medications (Cardiovascular):     nitroGLYCERIN (NITRO-DUR) 0.2 mg/hr patch, Apply 1/4 of a patch to skin once daily.     Current Outpatient Medications (Other):    cephALEXin  (KEFLEX ) 500 MG capsule, Take 1 capsule (500 mg total) by mouth 2 (two) times daily.   Zoster Vaccine Adjuvanted (SHINGRIX ) injection, Inject into the muscle.   Reviewed prior external information including notes and imaging from  primary care provider As well as notes that were available from care everywhere and other healthcare systems.  Past medical history, social, surgical and family history all reviewed in electronic medical record.  No pertanent information unless stated regarding to the chief complaint.   Review of Systems:  No headache, visual changes, nausea, vomiting, diarrhea, constipation, dizziness, abdominal pain, skin rash, fevers, chills, night sweats, weight loss, swollen lymph nodes, body aches, joint swelling, chest pain, shortness of breath, mood changes. POSITIVE muscle aches  Objective  Blood pressure (!) 148/100, pulse 77, height 5\' 10"  (1.778 m), SpO2 97%.   General: No apparent distress alert and oriented x3 mood and affect normal, dressed appropriately.  HEENT: Pupils equal, extraocular movements intact  Respiratory: Patient's speak in full sentences and does not appear short of breath  Cardiovascular:  No lower extremity edema, non tender, no erythema  Foot exam shows that patient is mildly tender to palpation but not really over the fifth metatarsal.  Seems to be more proximal to this.  Seems to be more in the soft tissue.  No pain on the posterior aspect of the lateral malleolus.  Limited muscular skeletal ultrasound was performed and interpreted by Ronnell Coins, M  Limited ultrasound shows some hypoechoic changes that is consistent with a new acute tear of the peroneal tendon.  No significant retraction noted.  Increasing in neovascularization noted.  No cortical irregularity noted of the fifth  metatarsal. Impression: Peroneal tendon tear     Impression and Recommendations:     The above documentation has been reviewed and is accurate and complete Angline Schweigert M Tiran Sauseda, DO

## 2024-04-20 ENCOUNTER — Ambulatory Visit (INDEPENDENT_AMBULATORY_CARE_PROVIDER_SITE_OTHER): Admitting: Family Medicine

## 2024-04-20 ENCOUNTER — Encounter: Payer: Self-pay | Admitting: Family Medicine

## 2024-04-20 ENCOUNTER — Ambulatory Visit (INDEPENDENT_AMBULATORY_CARE_PROVIDER_SITE_OTHER)

## 2024-04-20 ENCOUNTER — Other Ambulatory Visit: Payer: Self-pay

## 2024-04-20 VITALS — BP 148/100 | HR 77 | Ht 70.0 in

## 2024-04-20 DIAGNOSIS — S86312A Strain of muscle(s) and tendon(s) of peroneal muscle group at lower leg level, left leg, initial encounter: Secondary | ICD-10-CM | POA: Insufficient documentation

## 2024-04-20 DIAGNOSIS — M79672 Pain in left foot: Secondary | ICD-10-CM

## 2024-04-20 MED ORDER — NITROGLYCERIN 0.2 MG/HR TD PT24: MEDICATED_PATCH | TRANSDERMAL | 0 refills | Status: DC

## 2024-04-20 MED ORDER — NITROGLYCERIN 0.2 MG/HR TD PT24: MEDICATED_PATCH | TRANSDERMAL | 1 refills | Status: DC

## 2024-04-20 NOTE — Patient Instructions (Signed)
 Good to see you  Peroneal tendon tear  Wear boot daily for 3 weeks Ok to come out of the boot when not weight bearing  Nitroglycerin Protocol   Apply 1/4 nitroglycerin patch to affected area daily.  Change position of patch within the affected area every 24 hours.  You may experience a headache during the first 1-2 weeks of using the patch, these should subside.  If you experience headaches after beginning nitroglycerin patch treatment, you may take your preferred over the counter pain reliever.  Another side effect of the nitroglycerin patch is skin irritation or rash related to patch adhesive.  Please notify our office if you develop more severe headaches or rash, and stop the patch.  Tendon healing with nitroglycerin patch may require 12 to 24 weeks depending on the extent of injury.  Men should not use if taking Viagra, Cialis, or Levitra.   Do not use if you have migraines or rosacea. See me again in 3 weeks (ok to double book)

## 2024-04-20 NOTE — Assessment & Plan Note (Signed)
 Peroneal tendon tear, starting nitroglycerin and warned of potential side effects.  Discussed CAM Walker, discussed icing regimen and home exercises.  Discussed when patient is not weightbearing to do range of motion exercises.  Discussed that I would like to see him again in 3 weeks to make sure we are seeing some healing aspect and will advance accordingly.  X-rays are still pending at this time.

## 2024-04-23 ENCOUNTER — Ambulatory Visit: Payer: Self-pay | Admitting: Family Medicine

## 2024-05-10 NOTE — Progress Notes (Unsigned)
 [facility] [address] [place] [address] Phone: [phone] Subjective:   Steven Bender, am serving as a scribe for Dr. Arthea Bender.  I'm seeing this patient by the request  of:  de [place], Steven Bender  CC: Ankle pain follow-up  YEP:Dlagzrupcz  04/20/2024 Peroneal tendon tear, starting nitroglycerin  and warned of potential side effects.  Discussed CAM Walker, discussed icing regimen and home exercises.  Discussed when patient is not weightbearing to do range of motion exercises.  Discussed that I would like to see him again in 3 weeks to make sure we are seeing some healing aspect and will advance accordingly.  X-rays are still pending at this time.      Update 05/13/2024 Steven Bender is a 63 y.o. male coming in with complaint of L foot pain.  Found to have more of a peroneal tendon injury.  Last seen 3 weeks ago.  Did have x-rays of the foot that showed some mild arthritis but otherwise fairly unremarkable.  Patient states that he is doing much better. Does have some discomfort over insertion of peroneal tendon. Walked 2 miles in boot without pain.       Past Medical History:  Diagnosis Date   Abnormal EKG    Loss of consciousness (HCC) 02/19/2017   Syncope and collapse    02/19/2017   Past Surgical History:  Procedure Laterality Date   ADENOIDECTOMY     Social History   Socioeconomic History   Marital status: Married    Spouse name: Not on file   Number of children: Not on file   Years of education: Not on file   Highest education level: Not on file  Occupational History   Not on file  Tobacco Use   Smoking status: Never   Smokeless tobacco: Never  Substance and Sexual Activity   Alcohol use: Yes    Comment: occ   Drug use: No   Sexual activity: Not on file  Other Topics Concern   Not on file  Social History Narrative   Not on file   Social Drivers of Health   Financial Resource Strain: Not on file  Food  Insecurity: Not on file  Transportation Needs: Not on file  Physical Activity: Not on file  Stress: Not on file  Social Connections: Not on file   Allergies  Allergen Reactions   Beef-Derived Drug Products Nausea And Vomiting   Family History  Problem Relation Age of Onset   Heart disease Father        pt cannot recall exact details   Hyperlipidemia Mother    Stroke Mother    Depression Brother    Heart disease Brother    Diabetes Brother    Diabetes Sister    Diabetes Brother      Current Outpatient Medications (Cardiovascular):    nitroGLYCERIN  (NITRO-DUR ) 0.2 mg/hr patch, Apply 1/4 of a patch to skin once daily.     Current Outpatient Medications (Other):    cephALEXin  (KEFLEX ) 500 MG capsule, Take 1 capsule (500 mg total) by mouth 2 (two) times daily.   Zoster Vaccine Adjuvanted (SHINGRIX ) injection, Inject into the muscle.   Reviewed prior external information including notes and imaging from  primary care provider As well as notes that were available from care everywhere and other healthcare systems.  Past medical history, social, surgical and family history all reviewed in electronic medical record.  No pertanent information unless stated regarding to the chief  complaint.   Review of Systems:  No headache, visual changes, nausea, vomiting, diarrhea, constipation, dizziness, abdominal pain, skin rash, fevers, chills, night sweats, weight loss, swollen lymph nodes, body aches, joint swelling, chest pain, shortness of breath, mood changes. POSITIVE muscle aches  Objective  Blood pressure 130/88, pulse 73, height 5' 10 (1.778 m), SpO2 98%.   General: No apparent distress alert and oriented x3 mood and affect normal, dressed appropriately.  HEENT: Pupils equal, extraocular movements intact  Respiratory: Patient's speak in full sentences and does not appear short of breath  Cardiovascular: No lower extremity edema, non tender, no erythema  Left ankle exam shows no  significant swelling.  Mild tenderness and just proximal to the fifth metatarsal proximally.  No significant swelling, no limited range of motion of the ankle at the moment.   Limited muscular skeletal ultrasound was performed and interpreted by Steven Bender, M  Limited ultrasound of patient's peroneal tendon shows that there is significant decrease in the hypoechoic changes that was noted previously.  Significant decrease in the dilatation of the tendon as well noted. Impression: Interval improvement   Impression and Recommendations:     The above documentation has been reviewed and is accurate and complete Steven Gracey M Hendrik Donath, DO

## 2024-05-13 ENCOUNTER — Ambulatory Visit: Admitting: Family Medicine

## 2024-05-13 ENCOUNTER — Encounter: Payer: Self-pay | Admitting: Family Medicine

## 2024-05-13 ENCOUNTER — Other Ambulatory Visit: Payer: Self-pay

## 2024-05-13 VITALS — BP 130/88 | HR 73 | Ht 70.0 in

## 2024-05-13 DIAGNOSIS — S86312A Strain of muscle(s) and tendon(s) of peroneal muscle group at lower leg level, left leg, initial encounter: Secondary | ICD-10-CM | POA: Diagnosis not present

## 2024-05-13 DIAGNOSIS — M79672 Pain in left foot: Secondary | ICD-10-CM

## 2024-05-13 NOTE — Assessment & Plan Note (Signed)
 Significant improvement noted at this time.  Has progressed to mild improvement, heel lift discussed, discussed icing regimen and home exercises, follow-up again in 6 to 8 weeks.  Given a splint for the ankle and to start to transition into more aggressive activity but avoid any type of stress on the peroneal tendon.

## 2024-05-13 NOTE — Patient Instructions (Signed)
 Air cast Heel lift in tennis shoes Ankle HEP start after 4th of July  [place] only at the wedding  Continue the nitro See me again in early August

## 2024-06-22 NOTE — Progress Notes (Unsigned)
 [facility] [address] [place] [address] Phone: [phone] Subjective:   Steven Bender, am serving as a scribe for Dr. Arthea Bender.  I'm seeing this patient by the request  of:  [place], Steven PARAS, MD  CC: Left foot pain  YEP:Steven Bender  05/13/2024 Significant improvement noted at this time.  Has progressed to mild improvement, heel lift discussed, discussed icing regimen and home exercises, follow-up again in 6 to 8 weeks.  Given a splint for the ankle and to start to transition into more aggressive activity but avoid any type of stress on the peroneal tendon      Update 06/24/2024 Steven Bender is a 63 y.o. male coming in with complaint of L foot pain. Patient states that he is doing better. Pain occurs when he walks without supportive shoes on hard surface. Pain is achy at times and ice resolves his symptoms.       Past Medical History:  Diagnosis Date   Abnormal EKG    Loss of consciousness (HCC) 02/19/2017   Syncope and collapse    02/19/2017   Past Surgical History:  Procedure Laterality Date   ADENOIDECTOMY     Social History   Socioeconomic History   Marital status: Married    Spouse name: Not on file   Number of children: Not on file   Years of education: Not on file   Highest education level: Not on file  Occupational History   Not on file  Tobacco Use   Smoking status: Never   Smokeless tobacco: Never  Substance and Sexual Activity   Alcohol use: Yes    Comment: occ   Drug use: No   Sexual activity: Not on file  Other Topics Concern   Not on file  Social History Narrative   Not on file   Social Drivers of Health   Financial Resource Strain: Not on file  Food Insecurity: Not on file  Transportation Needs: Not on file  Physical Activity: Not on file  Stress: Not on file  Social Connections: Not on file   Allergies  Allergen Reactions   Beef-Derived Drug Products Nausea And Vomiting   Family  History  Problem Relation Age of Onset   Heart disease Father        pt cannot recall exact details   Hyperlipidemia Mother    Stroke Mother    Depression Brother    Heart disease Brother    Diabetes Brother    Diabetes Sister    Diabetes Brother      Current Outpatient Medications (Cardiovascular):    nitroGLYCERIN  (NITRO-DUR ) 0.2 mg/hr patch, Apply 1/4 of a patch to skin once daily.     Current Outpatient Medications (Other):    cephALEXin  (KEFLEX ) 500 MG capsule, Take 1 capsule (500 mg total) by mouth 2 (two) times daily.   Zoster Vaccine Adjuvanted (SHINGRIX ) injection, Inject into the muscle.   Objective  Blood pressure (!) 142/90, pulse 70, height 5' 10 (1.778 m), weight 210 lb (95.3 kg), SpO2 98%.   General: No apparent distress alert and oriented x3 mood and affect normal, dressed appropriately.  HEENT: Pupils equal, extraocular movements intact  Respiratory: Patient's speak in full sentences and does not appear short of breath  Cardiovascular: No lower extremity edema, non tender, no erythema  Left ankle has significant improvement in range of motion.  Full strength noted.  No significant swelling noted at the moment.   Limited muscular skeletal ultrasound  was performed and interpreted by Steven Bender, M  Limited ultrasound shows patient peroneal tendon seems to be well-healed at this time.  Some mild scarring still noted but nothing severe.  Very minimal hypoechoic changes consistent with mild tendinopathy. Impression: Healed peroneal tendon   Impression and Recommendations:    The above documentation has been reviewed and is accurate and complete Steven Bender M Steven Lagman, DO

## 2024-06-24 ENCOUNTER — Other Ambulatory Visit: Payer: Self-pay

## 2024-06-24 ENCOUNTER — Ambulatory Visit: Admitting: Family Medicine

## 2024-06-24 ENCOUNTER — Encounter: Payer: Self-pay | Admitting: Family Medicine

## 2024-06-24 VITALS — BP 142/90 | HR 70 | Ht 70.0 in | Wt 210.0 lb

## 2024-06-24 DIAGNOSIS — S86312A Strain of muscle(s) and tendon(s) of peroneal muscle group at lower leg level, left leg, initial encounter: Secondary | ICD-10-CM

## 2024-06-24 DIAGNOSIS — M79672 Pain in left foot: Secondary | ICD-10-CM

## 2024-06-24 NOTE — Assessment & Plan Note (Signed)
 Hernia tendon tear seems to be completely healed with some scar tissue formation noted at this time.  Will start increasing activity extremely slowly.  I am anticipate the patient doing well.  Discussed icing after increasing activity such as golf and tennis.  Follow-up with me as needed

## 2024-09-10 ENCOUNTER — Observation Stay (HOSPITAL_COMMUNITY)

## 2024-09-10 ENCOUNTER — Encounter (HOSPITAL_COMMUNITY): Payer: Self-pay | Admitting: Internal Medicine

## 2024-09-10 ENCOUNTER — Emergency Department (HOSPITAL_COMMUNITY)

## 2024-09-10 ENCOUNTER — Other Ambulatory Visit: Payer: Self-pay

## 2024-09-10 ENCOUNTER — Ambulatory Visit: Payer: Self-pay

## 2024-09-10 ENCOUNTER — Observation Stay (HOSPITAL_COMMUNITY)
Admission: EM | Admit: 2024-09-10 | Discharge: 2024-09-11 | Disposition: A | Discharge status: Home / Self Care | Attending: Internal Medicine | Admitting: Internal Medicine

## 2024-09-10 DIAGNOSIS — I639 Cerebral infarction, unspecified: Secondary | ICD-10-CM | POA: Diagnosis present

## 2024-09-10 DIAGNOSIS — Z79899 Other long term (current) drug therapy: Secondary | ICD-10-CM | POA: Diagnosis not present

## 2024-09-10 DIAGNOSIS — I6389 Other cerebral infarction: Principal | ICD-10-CM | POA: Insufficient documentation

## 2024-09-10 DIAGNOSIS — I63311 Cerebral infarction due to thrombosis of right middle cerebral artery: Secondary | ICD-10-CM

## 2024-09-10 DIAGNOSIS — Z23 Encounter for immunization: Secondary | ICD-10-CM | POA: Diagnosis not present

## 2024-09-10 DIAGNOSIS — I10 Essential (primary) hypertension: Secondary | ICD-10-CM | POA: Insufficient documentation

## 2024-09-10 DIAGNOSIS — R531 Weakness: Principal | ICD-10-CM

## 2024-09-10 DIAGNOSIS — I700 Atherosclerosis of aorta: Secondary | ICD-10-CM | POA: Insufficient documentation

## 2024-09-10 DIAGNOSIS — G459 Transient cerebral ischemic attack, unspecified: Secondary | ICD-10-CM

## 2024-09-10 DIAGNOSIS — R030 Elevated blood-pressure reading, without diagnosis of hypertension: Secondary | ICD-10-CM | POA: Diagnosis present

## 2024-09-10 DIAGNOSIS — R29700 NIHSS score 0: Secondary | ICD-10-CM

## 2024-09-10 DIAGNOSIS — E785 Hyperlipidemia, unspecified: Secondary | ICD-10-CM | POA: Diagnosis present

## 2024-09-10 DIAGNOSIS — F10920 Alcohol use, unspecified with intoxication, uncomplicated: Secondary | ICD-10-CM | POA: Diagnosis not present

## 2024-09-10 DIAGNOSIS — R42 Dizziness and giddiness: Secondary | ICD-10-CM | POA: Diagnosis present

## 2024-09-10 LAB — DIFFERENTIAL
Abs Immature Granulocytes: 0.03 K/uL (ref 0.00–0.07)
Basophils Absolute: 0.1 K/uL (ref 0.0–0.1)
Basophils Relative: 1 %
Eosinophils Absolute: 0.2 K/uL (ref 0.0–0.5)
Eosinophils Relative: 2 %
Immature Granulocytes: 0 %
Lymphocytes Relative: 22 %
Lymphs Abs: 1.8 K/uL (ref 0.7–4.0)
Monocytes Absolute: 0.6 K/uL (ref 0.1–1.0)
Monocytes Relative: 8 %
Neutro Abs: 5.5 K/uL (ref 1.7–7.7)
Neutrophils Relative %: 67 %

## 2024-09-10 LAB — CBC
HCT: 50.6 % (ref 39.0–52.0)
Hemoglobin: 16.5 g/dL (ref 13.0–17.0)
MCH: 30.2 pg (ref 26.0–34.0)
MCHC: 32.6 g/dL (ref 30.0–36.0)
MCV: 92.5 fL (ref 80.0–100.0)
Platelets: 261 K/uL (ref 150–400)
RBC: 5.47 MIL/uL (ref 4.22–5.81)
RDW: 12 % (ref 11.5–15.5)
WBC: 8.2 K/uL (ref 4.0–10.5)
nRBC: 0 % (ref 0.0–0.2)

## 2024-09-10 LAB — COMPREHENSIVE METABOLIC PANEL WITH GFR
ALT: 20 U/L (ref 0–44)
AST: 24 U/L (ref 15–41)
Albumin: 4.3 g/dL (ref 3.5–5.0)
Alkaline Phosphatase: 77 U/L (ref 38–126)
Anion gap: 10 (ref 5–15)
BUN: 11 mg/dL (ref 8–23)
CO2: 25 mmol/L (ref 22–32)
Calcium: 9.6 mg/dL (ref 8.9–10.3)
Chloride: 102 mmol/L (ref 98–111)
Creatinine, Ser: 0.95 mg/dL (ref 0.61–1.24)
GFR, Estimated: >60 mL/min (ref >60)
Glucose, Bld: 123 mg/dL — ABNORMAL HIGH (ref 70–99)
Potassium: 4.3 mmol/L (ref 3.5–5.1)
Sodium: 137 mmol/L (ref 135–145)
Total Bilirubin: 0.8 mg/dL (ref 0.0–1.2)
Total Protein: 7.9 g/dL (ref 6.5–8.1)

## 2024-09-10 LAB — I-STAT CHEM 8, ED
BUN: 14 mg/dL (ref 8–23)
Calcium, Ion: 1.23 mmol/L (ref 1.15–1.40)
Chloride: 102 mmol/L (ref 98–111)
Creatinine, Ser: 0.9 mg/dL (ref 0.61–1.24)
Glucose, Bld: 121 mg/dL — ABNORMAL HIGH (ref 70–99)
HCT: 52 % (ref 39.0–52.0)
Hemoglobin: 17.7 g/dL — ABNORMAL HIGH (ref 13.0–17.0)
Potassium: 4.4 mmol/L (ref 3.5–5.1)
Sodium: 140 mmol/L (ref 135–145)
TCO2: 27 mmol/L (ref 22–32)

## 2024-09-10 LAB — LIPID PANEL
Cholesterol: 247 mg/dL — ABNORMAL HIGH (ref 0–200)
HDL: 50 mg/dL (ref >40)
LDL Cholesterol: 173 mg/dL — ABNORMAL HIGH (ref 0–99)
Total CHOL/HDL Ratio: 4.9 ratio
Triglycerides: 119 mg/dL (ref <150)
VLDL: 24 mg/dL (ref 0–40)

## 2024-09-10 LAB — ECHOCARDIOGRAM COMPLETE
AR max vel: 3.14 cm2
AV Area VTI: 3.18 cm2
AV Area mean vel: 3.22 cm2
AV Mean grad: 2 mmHg
AV Peak grad: 5.6 mmHg
Ao pk vel: 1.18 m/s
Area-P 1/2: 2.93 cm2
Calc EF: 57.4 %
Height: 70 in
MV VTI: 3.17 cm2
S' Lateral: 3.3 cm
Single Plane A2C EF: 56.4 %
Single Plane A4C EF: 57.2 %
Weight: 3322.77 [oz_av]

## 2024-09-10 LAB — PROTIME-INR
INR: 1 (ref 0.8–1.2)
Prothrombin Time: 14 s (ref 11.4–15.2)

## 2024-09-10 LAB — CBG MONITORING, ED: Glucose-Capillary: 125 mg/dL — ABNORMAL HIGH (ref 70–99)

## 2024-09-10 LAB — ETHANOL: Alcohol, Ethyl (B): <15 mg/dL (ref <15)

## 2024-09-10 LAB — HEMOGLOBIN A1C
Hgb A1c MFr Bld: 5.3 % (ref 4.8–5.6)
Mean Plasma Glucose: 105.41 mg/dL

## 2024-09-10 LAB — APTT: aPTT: 24 s (ref 24–36)

## 2024-09-10 LAB — HIV ANTIBODY (ROUTINE TESTING W REFLEX): HIV Screen 4th Generation wRfx: NONREACTIVE

## 2024-09-10 MED ORDER — ENOXAPARIN SODIUM 40 MG/0.4ML IJ SOSY
40.0000 mg | PREFILLED_SYRINGE | INTRAMUSCULAR | Status: DC
  Administered 2024-09-10: 40 mg via SUBCUTANEOUS
  Filled 2024-09-10: qty 0.4

## 2024-09-10 MED ORDER — ASPIRIN 325 MG PO TABS
325.0000 mg | ORAL_TABLET | Freq: Once | ORAL | Status: AC
  Administered 2024-09-10: 325 mg via ORAL
  Filled 2024-09-10: qty 1

## 2024-09-10 MED ORDER — ATORVASTATIN CALCIUM 80 MG PO TABS
80.0000 mg | ORAL_TABLET | Freq: Every evening | ORAL | Status: DC
  Administered 2024-09-10 – 2024-09-11 (×2): 80 mg via ORAL
  Filled 2024-09-10 (×2): qty 1

## 2024-09-10 MED ORDER — INFLUENZA VIRUS VACC SPLIT PF (FLUZONE) 0.5 ML IM SUSY
0.5000 mL | PREFILLED_SYRINGE | INTRAMUSCULAR | Status: AC
  Administered 2024-09-11: 0.5 mL via INTRAMUSCULAR
  Filled 2024-09-10: qty 0.5

## 2024-09-10 MED ORDER — ALBUTEROL SULFATE (2.5 MG/3ML) 0.083% IN NEBU
2.5000 mg | INHALATION_SOLUTION | Freq: Four times a day (QID) | RESPIRATORY_TRACT | Status: DC
  Filled 2024-09-10 (×2): qty 3

## 2024-09-10 MED ORDER — ACETAMINOPHEN 325 MG PO TABS: 650.0000 mg | ORAL_TABLET | Freq: Four times a day (QID) | ORAL | Status: DC | PRN

## 2024-09-10 MED ORDER — HYDRALAZINE HCL 20 MG/ML IJ SOLN: 10.0000 mg | INTRAMUSCULAR | Status: DC | PRN

## 2024-09-10 MED ORDER — IOHEXOL 350 MG/ML SOLN
75.0000 mL | Freq: Once | INTRAVENOUS | Status: AC | PRN
Start: 2024-09-10 — End: 2024-09-10
  Administered 2024-09-10: 75 mL via INTRAVENOUS

## 2024-09-10 MED ORDER — CLOPIDOGREL BISULFATE 300 MG PO TABS
300.0000 mg | ORAL_TABLET | Freq: Once | ORAL | Status: AC
  Administered 2024-09-10: 300 mg via ORAL
  Filled 2024-09-10: qty 1

## 2024-09-10 MED ORDER — ASPIRIN 81 MG PO TBEC
81.0000 mg | DELAYED_RELEASE_TABLET | Freq: Every day | ORAL | Status: DC
  Administered 2024-09-11: 81 mg via ORAL
  Filled 2024-09-10: qty 1

## 2024-09-10 MED ORDER — SODIUM CHLORIDE 0.9% FLUSH
3.0000 mL | Freq: Two times a day (BID) | INTRAVENOUS | Status: DC
  Administered 2024-09-10 – 2024-09-11 (×2): 3 mL via INTRAVENOUS

## 2024-09-10 MED ORDER — SODIUM CHLORIDE 0.9% FLUSH
3.0000 mL | Freq: Once | INTRAVENOUS | Status: AC
  Administered 2024-09-10: 3 mL via INTRAVENOUS

## 2024-09-10 MED ORDER — CLOPIDOGREL BISULFATE 75 MG PO TABS
75.0000 mg | ORAL_TABLET | Freq: Every day | ORAL | Status: DC
  Administered 2024-09-11: 75 mg via ORAL
  Filled 2024-09-10: qty 1

## 2024-09-10 MED ORDER — STROKE: EARLY STAGES OF RECOVERY BOOK
Freq: Once | Status: AC
  Filled 2024-09-10: qty 1

## 2024-09-10 MED ORDER — ONDANSETRON HCL 4 MG/2ML IJ SOLN: 4.0000 mg | Freq: Four times a day (QID) | INTRAMUSCULAR | Status: DC | PRN

## 2024-09-10 MED ORDER — ACETAMINOPHEN 650 MG RE SUPP: 650.0000 mg | Freq: Four times a day (QID) | RECTAL | Status: DC | PRN

## 2024-09-10 MED ORDER — ONDANSETRON HCL 4 MG PO TABS: 4.0000 mg | ORAL_TABLET | Freq: Four times a day (QID) | ORAL | Status: DC | PRN

## 2024-09-10 NOTE — ED Triage Notes (Signed)
 PT presents with Left leg and arm weakness and dizziness. LKW 0630. Pt observed walking in from car with altered gait.

## 2024-09-10 NOTE — Consult Note (Signed)
 NEUROLOGY CONSULT NOTE   Date of service: September 10, 2024 Patient Name: Steven Bender MRN:  [id number] DOB:  [birth year] Chief Complaint: L sided weakness off balance Requesting Provider: Levander Houston, MD  History of Present Illness  Steven Bender is a 63 y.o. male with hx of syncope, HTN, who presents with sudden onset L sided weakness and off balance. He woke up very early today. At 630, went downstairs to turn on the water. Felt like his left leg was weak. Leg kept hitting the stair treads. It was difficult  to control and move his L leg. Talked to his wife around 0930 and was brought in to the ED. Symptoms were mild on arrival to the ED. A code stroke was activated.  Symptoms had resolved prior to imaging.  LKW: 0630 Modified rankin score: 0-Completely asymptomatic and back to baseline post- stroke IV Thrombolysis: not offered, due to complete resolution of symptoms. EVT: not offered, not consistent with LVO and symptoms had completely resolved.  NIHSS components Score: Comment  1a Level of Conscious 0[]  1[]  2[]  3[]      1b LOC Questions 0[]  1[]  2[]       1c LOC Commands 0[]  1[]  2[]       2 Best Gaze 0[]  1[]  2[]       3 Visual 0[]  1[]  2[]  3[]      4 Facial Palsy 0[]  1[]  2[]  3[]      5a Motor Arm - left 0[]  1[]  2[]  3[]  4[]  UN[]    5b Motor Arm - Right 0[]  1[]  2[]  3[]  4[]  UN[]    6a Motor Leg - Left 0[]  1[]  2[]  3[]  4[]  UN[]    6b Motor Leg - Right 0[]  1[]  2[]  3[]  4[]  UN[]    7 Limb Ataxia 0[]  1[]  2[]  UN[]      8 Sensory 0[]  1[]  2[]  UN[]      9 Best Language 0[]  1[]  2[]  3[]      10 Dysarthria 0[]  1[]  2[]  UN[]      11 Extinct. and Inattention 0[]  1[]  2[]       TOTAL: 0      ROS  Comprehensive ROS performed and pertinent positives documented in HPI   Past History   Past Medical History:  Diagnosis Date   Abnormal EKG    Loss of consciousness (HCC) 02/19/2017   Syncope and collapse    02/19/2017    Past Surgical History:  Procedure Laterality Date   ADENOIDECTOMY       Family History: Family History  Problem Relation Age of Onset   Heart disease Father        pt cannot recall exact details   Hyperlipidemia Mother    Stroke Mother    Depression Brother    Heart disease Brother    Diabetes Brother    Diabetes Sister    Diabetes Brother     Social History  reports that he has never smoked. He has never used smokeless tobacco. He reports current alcohol use. He reports that he does not use drugs.  Allergies  Allergen Reactions   Bovine (Beef) Protein-Containing Drug Products Nausea And Vomiting    Medications  No current facility-administered medications for this encounter.  Current Outpatient Medications:    cephALEXin  (KEFLEX ) 500 MG capsule, Take 1 capsule (500 mg total) by mouth 2 (two) times daily., Disp: 20 capsule, Rfl: 0   nitroGLYCERIN  (NITRO-DUR ) 0.2 mg/hr patch, Apply 1/4 of a patch to skin once daily., Disp: 30 patch, Rfl: 0   Zoster Vaccine Adjuvanted (SHINGRIX ) injection, Inject into  the muscle., Disp: 0.5 mL, Rfl: 0  Vitals   Vitals:   10/09/24 1010 2024-10-09 1029  BP: (!) 201/84   Pulse: 71   Resp: 19   Temp: 97.6 F (36.4 C)   SpO2: 97%   Weight: 94.2 kg 94.2 kg  Height:  5' 10 (1.778 m)    Body mass index is 29.8 kg/m.   Physical Exam   General: Laying comfortably in bed; in no acute distress.  HENT: Normal oropharynx and mucosa. Normal external appearance of ears and nose.  Neck: Supple, no pain or tenderness  CV: No JVD. No peripheral edema.  Pulmonary: Symmetric Chest rise. Normal respiratory effort.  Abdomen: Soft to touch, non-tender.  Ext: No cyanosis, edema, or deformity  Skin: No rash. Normal palpation of skin.   Musculoskeletal: Normal digits and nails by inspection. No clubbing.   Neurologic Examination  Mental status/Cognition: Alert, oriented to self, place, month and year, good attention.  Speech/language: Fluent, comprehension intact, object naming intact, repetition intact.  Cranial  nerves:   CN II Pupils equal and reactive to light, no VF deficits    CN III,IV,VI EOM intact, no gaze preference or deviation, no nystagmus    CN V normal sensation in V1, V2, and V3 segments bilaterally    CN VII no asymmetry, no nasolabial fold flattening    CN VIII normal hearing to speech    CN IX & X normal palatal elevation, no uvular deviation    CN XI 5/5 head turn and 5/5 shoulder shrug bilaterally    CN XII midline tongue protrusion    Motor:  Muscle bulk: normal, tone normal, pronator drift none tremor none Mvmt Root Nerve  Muscle Right Left Comments  SA C5/6 Ax Deltoid 5 5   EF C5/6 Mc Biceps 5 5   EE C6/7/8 Rad Triceps 5 5   WF C6/7 Med FCR     WE C7/8 PIN ECU     F Ab C8/T1 U ADM/FDI 5 5   HF L1/2/3 Fem Illopsoas 5 5   KE L2/3/4 Fem Quad 5 5   DF L4/5 D Peron Tib Ant 5 5   PF S1/2 Tibial Grc/Sol 5 5    Sensation:  Light touch Intact throughout   Pin prick    Temperature    Vibration   Proprioception    Coordination/Complex Motor:  - Finger to Nose intact BL - Heel to shin intact BL - Rapid alternating movement are normal - Gait: deferred.  Labs/Imaging/Neurodiagnostic studies   CBC:  Recent Labs  Lab 09-Oct-2024 1023  WBC 8.2  NEUTROABS 5.5  HGB 16.5  17.7*  HCT 50.6  52.0  MCV 92.5  PLT 261   Basic Metabolic Panel:  Lab Results  Component Value Date   NA 140 10-09-2024   K 4.4 Oct 09, 2024   CO2 22 06/19/2022   GLUCOSE 121 (H) 2024/10/09   BUN 14 2024-10-09   CREATININE 0.90 10/09/24   CALCIUM 10.3 (H) 06/19/2022   GFRNONAA >60 02/19/2017   GFRAA >60 02/19/2017   Lipid Panel:  Lab Results  Component Value Date   LDLCALC 173 (H) 06/19/2022   HgbA1c:  Lab Results  Component Value Date   HGBA1C 5.5 06/19/2022   Urine Drug Screen: No results found for: LABOPIA, COCAINSCRNUR, LABBENZ, AMPHETMU, THCU, LABBARB  Alcohol Level No results found for: [facility] INR  Lab Results  Component Value Date   INR 1.0 09-Oct-2024   APTT  No results found for: APTT AED  levels: No results found for: PHENYTOIN, ZONISAMIDE, LAMOTRIGINE, LEVETIRACETA  CT Head without contrast(Personally reviewed): CTH was negative for a large hypodensity concerning for a large territory infarct or hyperdensity concerning for an ICH  CT angio Head and Neck with contrast: Pending  MRI Brain: pending   ASSESSMENT   ARLOW SPIERS is a 63 y.o. male with hx of of HTN who presents with episode of L sided weakness and clumsiness and feeling off balance. Lasted about 4 hours and concerning for a TIA or minor ischemic stroke.  RECOMMENDATIONS  - Frequent Neuro checks per stroke unit protocol - Recommend brain imaging with MRI Brain without contrast - Recommend Vascular imaging with CTA head and neck - Recommend obtaining TTE  - Recommend obtaining Lipid panel with LDL - Please start statin if LDL > 70 - Recommend HbA1c to evaluate for diabetes and how well it is controlled. - Antithrombotic - Aspirin 324mg  once along with plavix 300mg  once, followed by Aspirin 81mg  daily and plavix 75mg  daily x 21 days, followed by Aspirin 81mg  daily alone. - Recommend DVT ppx - SBP goal - permissive hypertension first 24 h < 220/110. Held home meds.  - Recommend Telemetry monitoring for arrythmia - Recommend bedside swallow screen prior to PO intake. - Stroke education booklet - Recommend PT/OT/SLP consult - counseled on the importance of quitting smoking marijuana.  ______________________________________________________________________  Plan discussed with Dr. Levander with the ED team. Plan also discussed with patient and his wife at the bedside in detail and all questions were answered.  Signed, Jaylia Pettus, MD [facility]

## 2024-09-10 NOTE — Evaluation (Signed)
 Physical Therapy Evaluation Patient Details Name: Steven Bender MRN: [id number] DOB: July 25, 1961 Today's Date: 09/10/2024  History of Present Illness  Patient is a 63 y/o male admitted 09/10/24 with L side weakness, off balance and dizzy, initial SBP in 200's.  PMH positive for HTN, syncope.  Clinical Impression  Patient presents with mobility close to his baseline.  Reports symptoms earlier on L side that feel close to resolution now, just some decreased single limb stance on L as compared to R.  Able to negotiate steps appropriate to access his home.  Feel no further skilled PT needs at this time.         If plan is discharge home, recommend the following:     Can travel by private vehicle        Equipment Recommendations None recommended by PT  Recommendations for Other Services       Functional Status Assessment Patient has not had a recent decline in their functional status     Precautions / Restrictions Precautions Precautions: None Recall of Precautions/Restrictions: Intact      Mobility  Bed Mobility Overal bed mobility: Independent                  Transfers Overall transfer level: Independent                      Ambulation/Gait Ambulation/Gait assistance: Independent Gait Distance (Feet): 160 Feet   Gait Pattern/deviations: WFL(Within Functional Limits)       General Gait Details: no LOB with head rotation or head nods  Stairs Stairs: Yes Stairs assistance: Supervision Stair Management: One rail Left, Alternating pattern, Forwards Number of Stairs: 10 General stair comments: guarded for safety though no LOB  Wheelchair Mobility     Tilt Bed    Modified Rankin (Stroke Patients Only) Modified Rankin (Stroke Patients Only) Pre-Morbid Rankin Score: No symptoms Modified Rankin: No significant disability     Balance Overall balance assessment: Independent, Needs assistance             Standing balance comment:  reports difficulty with SLS on L as compared to R                             Pertinent Vitals/Pain Pain Assessment Pain Assessment: No/denies pain    Home Living Family/patient expects to be discharged to:: Private residence Living Arrangements: Spouse/significant other Available Help at Discharge: Family Type of Home: House Home Access: Stairs to enter   Secretary/administrator of Steps: 3 Alternate Level Stairs-Number of Steps: flight Home Layout: Two level Home Equipment: None      Prior Function Prior Level of Function : Independent/Modified Independent             Mobility Comments: plays tennis though reports had tendon injury in his foot then calf injury       Extremity/Trunk Assessment   Upper Extremity Assessment Upper Extremity Assessment: Overall WFL for tasks assessed (reports had difficulty alternating finger to nose ealier and better now)    Lower Extremity Assessment Lower Extremity Assessment: Overall WFL for tasks assessed    Cervical / Trunk Assessment Cervical / Trunk Assessment: Normal  Communication   Communication Communication: No apparent difficulties    Cognition Arousal: Alert Behavior During Therapy: [facility] for tasks assessed/performed  Following commands: Intact       Cueing       General Comments General comments (skin integrity, edema, etc.): wife present and supportive, discussed lifestyle modification for stroke prevention    Exercises     Assessment/Plan    PT Assessment Patient does not need any further PT services  PT Problem List         PT Treatment Interventions      PT Goals (Current goals can be found in the Care Plan section)  Acute Rehab PT Goals PT Goal Formulation: All assessment and education complete, DC therapy    Frequency       Co-evaluation               AM-PAC PT 6 Clicks Mobility  Outcome Measure Help needed turning from your  back to your side while in a flat bed without using bedrails?: None Help needed moving from lying on your back to sitting on the side of a flat bed without using bedrails?: None Help needed moving to and from a bed to a chair (including a wheelchair)?: None Help needed standing up from a chair using your arms (e.g., wheelchair or bedside chair)?: None Help needed to walk in hospital room?: None Help needed climbing 3-5 steps with a railing? : None 6 Click Score: 24    End of Session Equipment Utilized During Treatment: Gait belt Activity Tolerance: Patient tolerated treatment well Patient left: in bed   PT Visit Diagnosis: Muscle weakness (generalized) (M62.81)    Time: 8382-8374 PT Time Calculation (min) (ACUTE ONLY): 8 min   Charges:   PT Evaluation $PT Eval Low Complexity: 1 Low   PT General Charges $$ ACUTE PT VISIT: 1 Visit         Micheline Portal, PT Acute Rehabilitation Services [phone] 09/10/2024   Montie Portal 09/10/2024, 4:51 PM

## 2024-09-10 NOTE — ED Provider Notes (Signed)
  EMERGENCY DEPARTMENT AT [facility] Provider Note   CSN: [id number] Arrival date & time: 09/10/24  1006     Patient presents with: Code Stroke   Steven Bender is a 63 y.o. male.   HPI 63 year old male presents today complaining of left-sided weakness that began around 630 this morning.  He was up prior to that.  He then noticed that he had difficulty using his left foot.  Symptoms waxed and waned.  They did return more around 830.  He spoke with his wife who brought him to the ED. He was initially seen at the bridge had some left facial droop and mild left hand drift. He had been initiated as code stroke from triage.  He was seen by Dr. Kalaqdina at the bridge. Patient then was transported rapidly to CT scan.    Prior to Admission medications   Medication Sig Start Date End Date Taking? Authorizing Provider  cephALEXin  (KEFLEX ) 500 MG capsule Take 1 capsule (500 mg total) by mouth 2 (two) times daily. 12/16/22   Booker Darice SAUNDERS, FNP  nitroGLYCERIN  (NITRO-DUR ) 0.2 mg/hr patch Apply 1/4 of a patch to skin once daily. 04/20/24   Smith, Zachary M, DO  Zoster Vaccine Adjuvanted (SHINGRIX ) injection Inject into the muscle. 12/16/22   Luiz Channel, MD    Allergies: Bovine (beef) protein-containing drug products    Review of Systems  Updated Vital Signs BP (!) 158/92   Pulse 71   Temp 97.6 F (36.4 C)   Resp 19   Ht 1.778 m (5' 10)   Wt 94.2 kg   SpO2 97%   BMI 29.80 kg/m   Physical Exam Vitals reviewed.  Constitutional:      Appearance: Normal appearance.  HENT:     Head: Normocephalic.     Right Ear: External ear normal.     Left Ear: External ear normal.     Nose: Nose normal.     Mouth/Throat:     Pharynx: Oropharynx is clear.  Eyes:     Extraocular Movements: Extraocular movements intact.     Conjunctiva/sclera: Conjunctivae normal.     Pupils: Pupils are equal, round, and reactive to light.  Cardiovascular:     Rate and Rhythm: Normal  rate and regular rhythm.     Pulses: Normal pulses.     Heart sounds: Normal heart sounds.  Pulmonary:     Effort: Pulmonary effort is normal.     Breath sounds: Normal breath sounds.  Abdominal:     General: Abdomen is flat.     Palpations: Abdomen is soft.  Musculoskeletal:        General: Normal range of motion.     Cervical back: Normal range of motion.  Skin:    General: Skin is warm and dry.     Capillary Refill: Capillary refill takes less than 2 seconds.  Neurological:     General: No focal deficit present.     Mental Status: He is alert.     Sensory: No sensory deficit.     Motor: Weakness present.     Coordination: Coordination normal.     Comments: Mild facial asymmetry Mild left palmar drift Symptoms resolved on reexam No visual field deficits extraocular movements intact     (all labs ordered are listed, but only abnormal results are displayed) Labs Reviewed  COMPREHENSIVE METABOLIC PANEL WITH GFR - Abnormal; Notable for the following components:      Result Value   Glucose, Bld 123 (*)  All other components within normal limits  I-STAT CHEM 8, ED - Abnormal; Notable for the following components:   Glucose, Bld 121 (*)    Hemoglobin 17.7 (*)    All other components within normal limits  CBG MONITORING, ED - Abnormal; Notable for the following components:   Glucose-Capillary 125 (*)    All other components within normal limits  PROTIME-INR  CBC  DIFFERENTIAL  ETHANOL  APTT  LIPID PANEL  HEMOGLOBIN A1C    EKG: None  Radiology: CT HEAD CODE STROKE WO CONTRAST Result Date: 09/10/2024 EXAM: CT HEAD WITHOUT 09/10/2024 10:28:05 AM TECHNIQUE: CT of the head was performed without the administration of intravenous contrast. Automated exposure control, iterative reconstruction, and/or weight based adjustment of the mA/kV was utilized to reduce the radiation dose to as low as reasonably achievable. COMPARISON: None available. CLINICAL HISTORY: Neuro deficit,  acute, stroke suspected. Table formatting from the original note was not included.; Non con. Code stroke. ; PT presents with Left leg and arm weakness and dizziness. LKW 0630. Pt observed walking in from car with altered gait. FINDINGS: BRAIN AND VENTRICLES: No acute intracranial hemorrhage. No mass effect or midline shift. No extra-axial fluid collection. No evidence of acute infarct. No hydrocephalus. ORBITS: No acute abnormality. SINUSES AND MASTOIDS: No acute abnormality. SOFT TISSUES AND SKULL: No acute skull fracture. No acute soft tissue abnormality. [place] Stroke Program Early CT Score (ASPECTS) ----- Ganglionic (caudate, IC, lentiform nucleus, insula, M1-M3): 7 Supraganglionic (M4-M6): 3 Total: 10 IMPRESSION: 1. No acute intracranial abnormality. ASPECTS: 10. These results were communicated to Dr. Vanessa at 10:35 AM on 09/10/2024 by secure text page via the [facility] messaging system. Electronically signed by: Ryan Chess MD 09/10/2024 10:36 AM EDT RP Workstation: [id number]     .Critical Care  Performed by: Levander Houston, MD Authorized by: Levander Houston, MD   Critical care provider statement:    Critical care time (minutes):  60   Critical care time was exclusive of:  Separately billable procedures and treating other patients and teaching time   Critical care was necessary to treat or prevent imminent or life-threatening deterioration of the following conditions:  CNS failure or compromise   Critical care was time spent personally by me on the following activities:  Development of treatment plan with patient or surrogate, discussions with consultants, evaluation of patient's response to treatment, examination of patient, ordering and review of laboratory studies, ordering and review of radiographic studies, ordering and performing treatments and interventions, pulse oximetry, re-evaluation of patient's condition and review of old charts    Medications Ordered in the ED  sodium chloride   flush (NS) 0.9 % injection 3 mL (3 mLs Intravenous Given 09/10/24 1035)  aspirin tablet 325 mg (325 mg Oral Given 09/10/24 1035)  clopidogrel (PLAVIX) tablet 300 mg (300 mg Oral Given 09/10/24 1035)    Clinical Course as of 09/10/24 1110  Fri Sep 10, 2024  1056 Complete metabolic panel reviewed interpreted and normal with the exception of mildly elevated glucose at 123 CBC reviewed interpreted normal INR is normal Alcohol is less than 15 [DR]  1056 Head CT reviewed interpreted no evidence of acute abnormality noted radiologist interpretation concurs [DR]    Clinical Course User Index [DR] Levander Houston, MD                                 Medical Decision Making Amount and/or Complexity of Data Reviewed Labs: ordered. Radiology:  ordered.   63 year old male presented today with some left-sided weakness waxing and waning.  Initial blood pressures were 200.  Patient was seen as code stroke.  Airway was cleared and patient proceeded to CT scan.  Patient was evaluated by neurology. No evidence of acute intracranial hemorrhage, or mass noted on CT scan Patient's blood pressure is improving currently systolically is 160 and is asymptomatic on my exam Neurology advises they will give Plavix and patient should be admitted for stroke workup to medicine.  I discussed the above with the patient and his wife.  They understand he will be admitted for further evaluation. They understand that should alert somebody to any new symptoms. Hospitalist team has been paged .  Discussed with Dr. Claudene who accepts patient to hospitalist service    Final diagnoses:  Left-sided weakness    ED Discharge Orders     None          Levander Houston, MD 09/10/24 1110

## 2024-09-10 NOTE — Code Documentation (Signed)
 Stroke Response Nurse Documentation Code Documentation  Steven Bender is a 63 y.o. male arriving to [facility]  via Private Vehicle on 09/10/2024 with past medical hx of syncope & HTN. On No antithrombotic. Code stroke was activated by ED.   Patient from home where he was LKW at 0630 and now complaining of left leg weakness. Per patient, at 0630 he went downstairs and noted his leg felt weak and kept hitting the stair treds as well as having difficulty controlling and moving his left leg.   Stroke team at the bedside on patient arrival. Labs drawn and patient cleared for CT by Dr. Levander. Patient to CT with team. NIHSS 0, see documentation for details and code stroke times. he following imaging was completed:  CT Head. Patient is not a candidate for IV Thrombolytic due to cmplete resolution of symptoms per MD. Patient is not a candidate for IR due to no LVO noted on imaging per MD.   Care Plan: VS/NIHSS q88min until out of window at 1100, then q2hr x12hr, then q4hr; BP Goal <220/120.   Bedside handoff with ED RN Lonni.    Annabella DELENA Bame  Stroke Response RN

## 2024-09-10 NOTE — H&P (Signed)
 History and Physical    Patient: Steven Bender FMW:[id number] DOB: 01/20/1961 DOA: 09/10/2024 DOS: the patient was seen and examined on 09/10/2024 PCP: [place], [name] PARAS, MD  Patient coming from: Home via private vehicle  Chief Complaint:  Chief Complaint  Patient presents with   Code Stroke   HPI: Steven Bender is a 63 y.o. male with medical history significant of hypertension and syncope presented with  sudden onset left-sided weakness and dizziness.  The patient experienced a sudden inability to use his left leg, describing a loss of coordination in the left leg and foot, along with some issues with his left hand. Dizziness was also noted. These symptoms began around 6:30 AM after being awake for about 20 minutes. Initially, he was able to walk but noticed the left leg was not functioning properly.  Since his left foot debating the stiffness he was climbing the stairs to go lay down.  After lying down and getting back up, the symptoms recurred, making it difficult to use the left leg again.  He is right-handed and did not experience any heart racing or chest pain. He recently traveled to [place] by plane, which was a two-hour flight, a few weeks ago. By the time of the conversation, his symptoms had resolved, as he was able to walk to the bathroom without issues after an MRI.  He does not smoke cigarettes but occasionally smokes marijuana and occasionally drinks alcohol.  Family history is significant for his mother having a stroke in her fifties due to high cholesterol, and his father having heart issues starting around age 3 or 50.  In the ED patient was seen as a code stroke.  Initial CT scan of the head did not reveal any acute abnormality.  Patient was not a candidate for thrombolytics low NIH score.  Noted to be afebrile with blood pressures elevated up to 201/84, and all other vital signs maintained.  Labs were relatively unremarkable.  TRH called to admit for completion  of stroke workup.   Review of Systems: As mentioned in the history of present illness. All other systems reviewed and are negative. Past Medical History:  Diagnosis Date   Abnormal EKG    Loss of consciousness (HCC) 02/19/2017   Syncope and collapse    02/19/2017   Past Surgical History:  Procedure Laterality Date   ADENOIDECTOMY     Social History:  reports that he has never smoked. He has never used smokeless tobacco. He reports current alcohol use. He reports that he does not use drugs.  Allergies  Allergen Reactions   Bovine (Beef) Protein-Containing Drug Products Nausea And Vomiting    Family History  Problem Relation Age of Onset   Heart disease Father        pt cannot recall exact details   Hyperlipidemia Mother    Stroke Mother    Depression Brother    Heart disease Brother    Diabetes Brother    Diabetes Sister    Diabetes Brother     Prior to Admission medications   Medication Sig Start Date End Date Taking? Authorizing Provider  cephALEXin  (KEFLEX ) 500 MG capsule Take 1 capsule (500 mg total) by mouth 2 (two) times daily. 12/16/22   Booker Darice SAUNDERS, FNP  nitroGLYCERIN  (NITRO-DUR ) 0.2 mg/hr patch Apply 1/4 of a patch to skin once daily. 04/20/24   Claudene Arthea HERO, DO  Zoster Vaccine Adjuvanted (SHINGRIX ) injection Inject into the muscle. 12/16/22   Luiz Channel, MD  Physical Exam: Vitals:   09/10/24 1010 09/10/24 1029 09/10/24 1030  BP: (!) 201/84  (!) 158/92  Pulse: 71    Resp: 19    Temp: 97.6 F (36.4 C)    SpO2: 97%    Weight: 94.2 kg 94.2 kg   Height:  5' 10 (1.778 m)   Constitutional: Older adult male NAD, calm, comfortable Eyes: PERRL, lids and conjunctivae normal ENMT: Mucous membranes are moist.  Normal dentition.  Neck: normal, supple  Respiratory: clear to auscultation bilaterally, no wheezing, no crackles. Normal respiratory effort. No accessory muscle use.  Cardiovascular: Regular rate and rhythm, no murmurs / rubs / gallops. No  extremity edema. 2+ pedal pulses.   Abdomen: no tenderness, no masses palpated. Bowel sounds positive.  Musculoskeletal: no clubbing / cyanosis. No joint deformity upper and lower extremities. Good ROM, no contractures. Normal muscle tone.  Skin: no rashes, lesions, ulcers. No induration Neurologic: CN 2-12 grossly intact. Sensation intact, Strength 5/5 in all 4.  Psychiatric: Normal judgment and insight. Alert and oriented x 3. Normal mood.   Data Reviewed:  EKG reveals sinus rhythm at 71 bpm.  Reviewed labs, imaging, and pertinent records as documented.  Assessment and Plan:  CVA Acute.  Patient presented with acute onset of left-sided weakness this morning at 6:30 AM.  Initial CT scan of the head did not reveal any acute abnormality.   Patient was not a candidate for thrombolytics due to minimal symptoms.  Lipid panel noted LDL 173 and hemoglobin A1c 5.3. Family history significant for mother with history of stroke.  CTA of the head and neck did not reveal any large vessel occlusion.  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   Patient has been loaded with aspirin and Plavix - Admit to telemetry bed - Neurochecks - Allowing for permissive hypertension at this time -Check echocardiogram - PT/OT/speech to evaluate and treat - Continue aspirin and Plavix for 21 days, followed by aspirin alone - Appreciate neurology consultative services, will follow-up for any further recommendations  Elevated blood pressure reading Acute.  Blood pressure noted to be elevated up to 201/84.  At baseline patient is not on any blood pressure medications. - Allowing permissive hypertension blood pressures elevated up to 220/110 - Consider need of starting blood pressure medication when deemed medically appropriate if blood pressures remain elevated  Hyperlipidemia LDL 173 - Goal LDL less than 70 - Start atorvastatin 80 mg daily  DVT prophylaxis:  Lovenox Advance Care Planning:   Code Status: Full Code   Consults: Neurology  Family Communication: Wife updated at bedside  Severity of Illness: The appropriate patient status for this patient is OBSERVATION. Observation status is judged to be reasonable and necessary in order to provide the required intensity of service to ensure the patient's safety. The patient's presenting symptoms, physical exam findings, and initial radiographic and laboratory data in the context of their medical condition is felt to place them at decreased risk for further clinical deterioration. Furthermore, it is anticipated that the patient will be medically stable for discharge from the hospital within 2 midnights of admission.   Author: Maximino DELENA Sharps, MD 09/10/2024 11:06 AM  For on call review [email or web address].

## 2024-09-10 NOTE — ED Notes (Signed)
 Patient transported to MRI

## 2024-09-10 NOTE — Telephone Encounter (Signed)
 FYI Only or Action Required?: FYI only for provider.  Patient was last seen in primary care on 12/16/2022 by Booker Darice SAUNDERS, FNP.  Called Nurse Triage reporting Dizziness.  Symptoms began today.  Interventions attempted: Nothing.  Symptoms are: gradually worsening.  Triage Disposition: Go to ED Now (Notify PCP)  Patient/caregiver understands and will follow disposition?: Unsure     Copied from CRM [id number]. Topic: Clinical - Red Word Triage >> Sep 10, 2024  9:06 AM Steven Bender wrote: Red Word that prompted transfer to Nurse Triage: balance is off, maybe a little bit foggy Reason for Disposition  [1] Weakness (i.e., paralysis, loss of muscle strength) of the face, arm / hand, or leg / foot on one side of the body AND [2] sudden onset AND [3] brief (now gone)  Answer Assessment - Initial Assessment Questions Patient concerned about a possible stroke. Pt states he has weakness in L hand and L leg and symptoms caused his knees to buckle. Pt advised to be seen today in ED for symptoms.    1. DESCRIPTION: Describe your dizziness.     Feels a foogy and balance is off  2. LIGHTHEADED: Do you feel lightheaded? (e.g., somewhat faint, woozy, weak upon standing)     Denies  3. VERTIGO: Do you feel like either you or the room is spinning or tilting? (i.e., vertigo)     Denies  4. SEVERITY: How bad is it?  Do you feel like you are going to faint? Can you stand and walk?     Moderate  5. ONSET:  When did the dizziness begin?     0630 this morning  6. AGGRAVATING FACTORS: Does anything make it worse? (e.g., standing, change in head position)     Standing  7. HEART RATE: Can you tell me your heart rate? How many beats in 15 seconds?  (Note: Not all patients can do this.)       Denies issues with HR  8. CAUSE: What do you think is causing the dizziness? (e.g., decreased fluids or food, diarrhea, emotional distress, heat exposure, new medicine, sudden standing, vomiting;  unknown)     Possible stress  9. OTHER SYMPTOMS: Do you have any other symptoms? (e.g., fever, chest pain, vomiting, diarrhea, bleeding)       Denies  Protocols used: Dizziness - Lightheadedness-A-AH

## 2024-09-11 ENCOUNTER — Other Ambulatory Visit (HOSPITAL_COMMUNITY): Payer: Self-pay

## 2024-09-11 DIAGNOSIS — I6381 Other cerebral infarction due to occlusion or stenosis of small artery: Secondary | ICD-10-CM | POA: Diagnosis not present

## 2024-09-11 DIAGNOSIS — E785 Hyperlipidemia, unspecified: Secondary | ICD-10-CM | POA: Diagnosis not present

## 2024-09-11 DIAGNOSIS — I7090 Unspecified atherosclerosis: Secondary | ICD-10-CM

## 2024-09-11 DIAGNOSIS — F1210 Cannabis abuse, uncomplicated: Secondary | ICD-10-CM | POA: Diagnosis not present

## 2024-09-11 DIAGNOSIS — I639 Cerebral infarction, unspecified: Secondary | ICD-10-CM | POA: Diagnosis not present

## 2024-09-11 LAB — CBC
HCT: 45.6 % (ref 39.0–52.0)
Hemoglobin: 15.2 g/dL (ref 13.0–17.0)
MCH: 30.1 pg (ref 26.0–34.0)
MCHC: 33.3 g/dL (ref 30.0–36.0)
MCV: 90.3 fL (ref 80.0–100.0)
Platelets: 249 K/uL (ref 150–400)
RBC: 5.05 MIL/uL (ref 4.22–5.81)
RDW: 12.2 % (ref 11.5–15.5)
WBC: 7.6 K/uL (ref 4.0–10.5)
nRBC: 0 % (ref 0.0–0.2)

## 2024-09-11 LAB — BASIC METABOLIC PANEL WITH GFR
Anion gap: 10 (ref 5–15)
BUN: 11 mg/dL (ref 8–23)
CO2: 23 mmol/L (ref 22–32)
Calcium: 9.1 mg/dL (ref 8.9–10.3)
Chloride: 100 mmol/L (ref 98–111)
Creatinine, Ser: 0.86 mg/dL (ref 0.61–1.24)
GFR, Estimated: >60 mL/min (ref >60)
Glucose, Bld: 99 mg/dL (ref 70–99)
Potassium: 3.9 mmol/L (ref 3.5–5.1)
Sodium: 133 mmol/L — ABNORMAL LOW (ref 135–145)

## 2024-09-11 LAB — RAPID URINE DRUG SCREEN, HOSP PERFORMED
Amphetamines: NOT DETECTED
Barbiturates: NOT DETECTED
Benzodiazepines: NOT DETECTED
Cocaine: NOT DETECTED
Opiates: NOT DETECTED
Tetrahydrocannabinol: POSITIVE — AB

## 2024-09-11 MED ORDER — CLOPIDOGREL BISULFATE 75 MG PO TABS
75.0000 mg | ORAL_TABLET | Freq: Every day | ORAL | 0 refills | Status: AC
  Filled 2024-09-11: qty 19, 19d supply, fill #0

## 2024-09-11 MED ORDER — ATORVASTATIN CALCIUM 80 MG PO TABS
80.0000 mg | ORAL_TABLET | Freq: Every evening | ORAL | 2 refills | Status: DC
  Filled 2024-09-11: qty 30, 30d supply, fill #0

## 2024-09-11 MED ORDER — LOSARTAN POTASSIUM 50 MG PO TABS
25.0000 mg | ORAL_TABLET | Freq: Every day | ORAL | Status: DC
  Administered 2024-09-11: 25 mg via ORAL
  Filled 2024-09-11: qty 1

## 2024-09-11 MED ORDER — ALBUTEROL SULFATE (2.5 MG/3ML) 0.083% IN NEBU: 2.5000 mg | INHALATION_SOLUTION | Freq: Four times a day (QID) | RESPIRATORY_TRACT | Status: DC | PRN

## 2024-09-11 MED ORDER — ASPIRIN 81 MG PO TBEC: 81.0000 mg | DELAYED_RELEASE_TABLET | Freq: Every day | ORAL | Status: AC

## 2024-09-11 MED ORDER — LOSARTAN POTASSIUM 25 MG PO TABS
25.0000 mg | ORAL_TABLET | Freq: Every day | ORAL | 2 refills | Status: DC
  Filled 2024-09-11: qty 30, 30d supply, fill #0

## 2024-09-11 NOTE — Progress Notes (Signed)
 OT Cancellation Note  Patient Details Name: [name] MRN: [id number] DOB: [birth year]   Cancelled Treatment:    Reason Eval/Treat Not Completed: OT screened, no needs identified, will sign off (Per PT, pt does not have OT needs. THank you.)  Lucie JONETTA Kendall 09/11/2024, 6:25 AM

## 2024-09-11 NOTE — Discharge Instructions (Signed)
 Keep a blood pressure log daily (check at random) and bring to your primary care follow up appointment.

## 2024-09-11 NOTE — Progress Notes (Signed)
 STROKE TEAM PROGRESS NOTE   SUBJECTIVE (INTERVAL HISTORY) His wife is at the bedside.  Overall his condition is completely resolved.  Patient left-sided weakness and discoordination has resolved.  MRI showed right PLIC infarct, small vessel disease.  He does have high blood pressure and high cholesterol, educated on aggressive risk factor modification and medication compliance.   OBJECTIVE Temp:  [97.6 F (36.4 C)-98 F (36.7 C)] 97.6 F (36.4 C) (10/25 1134) Pulse Rate:  [74-77] 76 (10/25 1134) Cardiac Rhythm: Normal sinus rhythm (10/25 0700) Resp:  [17-20] 20 (10/25 1134) BP: (154-173)/(89-105) 166/97 (10/25 1134) SpO2:  [94 %-97 %] 94 % (10/25 1134)  Recent Labs  Lab 09/10/24 1018  GLUCAP 125*   Recent Labs  Lab 09/10/24 1023 09/11/24 0204  NA 137  140 133*  K 4.3  4.4 3.9  CL 102  102 100  CO2 25 23  GLUCOSE 123*  121* 99  BUN 11  14 11   CREATININE 0.95  0.90 0.86  CALCIUM 9.6 9.1   Recent Labs  Lab 09/10/24 1023  AST 24  ALT 20  ALKPHOS 77  BILITOT 0.8  PROT 7.9  ALBUMIN 4.3   Recent Labs  Lab 09/10/24 1023 09/11/24 0204  WBC 8.2 7.6  NEUTROABS 5.5  --   HGB 16.5  17.7* 15.2  HCT 50.6  52.0 45.6  MCV 92.5 90.3  PLT 261 249   No results for input(s): CKTOTAL, CKMB, CKMBINDEX, TROPONINI in the last 168 hours. Recent Labs    09/10/24 1023  LABPROT 14.0  INR 1.0   No results for input(s): COLORURINE, LABSPEC, PHURINE, GLUCOSEU, HGBUR, BILIRUBINUR, KETONESUR, PROTEINUR, UROBILINOGEN, NITRITE, LEUKOCYTESUR in the last 72 hours.  Invalid input(s): APPERANCEUR     Component Value Date/Time   CHOL 247 (H) 09/10/2024 1023   CHOL 242 (H) 06/19/2022 0917   TRIG 119 09/10/2024 1023   HDL 50 09/10/2024 1023   HDL 47 06/19/2022 0917   CHOLHDL 4.9 09/10/2024 1023   VLDL 24 09/10/2024 1023   LDLCALC 173 (H) 09/10/2024 1023   LDLCALC 173 (H) 06/19/2022 0917   Lab Results  Component Value Date   HGBA1C 5.3  09/10/2024      Component Value Date/Time   LABOPIA NONE DETECTED 09/11/2024 0230   COCAINSCRNUR NONE DETECTED 09/11/2024 0230   LABBENZ NONE DETECTED 09/11/2024 0230   AMPHETMU NONE DETECTED 09/11/2024 0230   THCU POSITIVE (A) 09/11/2024 0230   LABBARB NONE DETECTED 09/11/2024 0230    Recent Labs  Lab 09/10/24 1023  ETH <15    I have personally reviewed the radiological images below and agree with the radiology interpretations.  ECHOCARDIOGRAM COMPLETE Result Date: 09/10/2024    ECHOCARDIOGRAM REPORT   Patient Name:   AMAURY KUZEL Date of Exam: 09/10/2024 Medical Rec #:  [id number]         Height:       70.0 in Accession #:    [id number]        Weight:       207.7 lb Date of Birth:  18-Oct-1961         BSA:          2.121 m Patient Age:    63 years          BP:           163/78 mmHg Patient Gender: M                 HR:  68 bpm. Exam Location:  Inpatient Procedure: 2D Echo, Color Doppler and Cardiac Doppler (Both Spectral and Color            Flow Doppler were utilized during procedure). Indications:    Stroke I63.9  History:        Patient has no prior history of Echocardiogram examinations.                 Abnormal ECG, Stroke; Signs/Symptoms:Syncope.  Sonographer:    BERNARDA ROCKS Referring Phys: [id number] [employer] [facility] [employer] KHALIQDINA IMPRESSIONS  1. Left ventricular ejection fraction, by estimation, is 55 to 60%. The left ventricle has normal function. The left ventricle has no regional wall motion abnormalities. Left ventricular diastolic parameters are consistent with Grade I diastolic dysfunction (impaired relaxation).  2. Right ventricular systolic function is normal. The right ventricular size is normal.  3. The mitral valve is degenerative. Trivial mitral valve regurgitation. No evidence of mitral stenosis.  4. The aortic valve is tricuspid. There is moderate calcification of the aortic valve. Aortic valve regurgitation is not visualized. Aortic valve sclerosis/calcification is  present, without any evidence of aortic stenosis.  5. The inferior vena cava is normal in size with greater than 50% respiratory variability, suggesting right atrial pressure of 3 mmHg. FINDINGS  Left Ventricle: Left ventricular ejection fraction, by estimation, is 55 to 60%. The left ventricle has normal function. The left ventricle has no regional wall motion abnormalities. The left ventricular internal cavity size was normal in size. There is  no left ventricular hypertrophy. Left ventricular diastolic parameters are consistent with Grade I diastolic dysfunction (impaired relaxation). Right Ventricle: The right ventricular size is normal. No increase in right ventricular wall thickness. Right ventricular systolic function is normal. Left Atrium: Left atrial size was normal in size. Right Atrium: Right atrial size was normal in size. Pericardium: There is no evidence of pericardial effusion. Mitral Valve: The mitral valve is degenerative in appearance. Mild mitral annular calcification. Trivial mitral valve regurgitation. No evidence of mitral valve stenosis. MV peak gradient, 2.2 mmHg. The mean mitral valve gradient is 1.0 mmHg. Tricuspid Valve: The tricuspid valve is normal in structure. Tricuspid valve regurgitation is trivial. No evidence of tricuspid stenosis. Aortic Valve: The aortic valve is tricuspid. There is moderate calcification of the aortic valve. Aortic valve regurgitation is not visualized. Aortic valve sclerosis/calcification is present, without any evidence of aortic stenosis. Aortic valve mean gradient measures 2.0 mmHg. Aortic valve peak gradient measures 5.6 mmHg. Aortic valve area, by VTI measures 3.18 cm. Pulmonic Valve: The pulmonic valve was normal in structure. Pulmonic valve regurgitation is not visualized. No evidence of pulmonic stenosis. Aorta: The aortic root is normal in size and structure. Venous: The inferior vena cava is normal in size with greater than 50% respiratory  variability, suggesting right atrial pressure of 3 mmHg. IAS/Shunts: No atrial level shunt detected by color flow Doppler.  LEFT VENTRICLE PLAX 2D LVIDd:         4.90 cm      Diastology LVIDs:         3.30 cm      LV e' medial:    10.30 cm/s LV PW:         0.90 cm      LV E/e' medial:  7.5 LV IVS:        0.80 cm      LV e' lateral:   13.70 cm/s LVOT diam:     2.00 cm      LV  E/e' lateral: 5.6 LV SV:         75 LV SV Index:   35 LVOT Area:     3.14 cm  LV Volumes (MOD) LV vol d, MOD A2C: 173.0 ml LV vol d, MOD A4C: 159.0 ml LV vol s, MOD A2C: 75.5 ml LV vol s, MOD A4C: 68.0 ml LV SV MOD A2C:     97.5 ml LV SV MOD A4C:     159.0 ml LV SV MOD BP:      96.4 ml RIGHT VENTRICLE             IVC RV Basal diam:  3.90 cm     IVC diam: 1.90 cm RV S prime:     13.60 cm/s TAPSE (M-mode): 3.0 cm LEFT ATRIUM             Index        RIGHT ATRIUM           Index LA diam:        3.80 cm 1.79 cm/m   RA Area:     15.80 cm LA Vol (A2C):   58.0 ml 27.35 ml/m  RA Volume:   34.50 ml  16.27 ml/m LA Vol (A4C):   49.6 ml 23.39 ml/m LA Biplane Vol: 55.3 ml 26.07 ml/m  AORTIC VALVE                    PULMONIC VALVE AV Area (Vmax):    3.14 cm     PV Vmax:       1.15 m/s AV Area (Vmean):   3.22 cm     PV Peak grad:  5.3 mmHg AV Area (VTI):     3.18 cm AV Vmax:           118.00 cm/s AV Vmean:          69.700 cm/s AV VTI:            0.235 m AV Peak Grad:      5.6 mmHg AV Mean Grad:      2.0 mmHg LVOT Vmax:         118.00 cm/s LVOT Vmean:        71.500 cm/s LVOT VTI:          0.238 m LVOT/AV VTI ratio: 1.01  AORTA Ao Root diam: 3.30 cm Ao Asc diam:  3.40 cm MITRAL VALVE MV Area (PHT): 2.93 cm    SHUNTS MV Area VTI:   3.17 cm    Systemic VTI:  0.24 m MV Peak grad:  2.2 mmHg    Systemic Diam: 2.00 cm MV Mean grad:  1.0 mmHg MV Vmax:       0.75 m/s MV Vmean:      45.1 cm/s MV Decel Time: 259 msec MV E velocity: 77.10 cm/s MV A velocity: 58.70 cm/s MV E/A ratio:  1.31 Toribio Fuel MD Electronically signed by Toribio Fuel MD  Signature Date/Time: 09/10/2024/5:43:56 PM    Final    MR BRAIN WO CONTRAST Result Date: 09/10/2024 EXAM: MRI BRAIN WITHOUT CONTRAST 09/10/2024 12:56:05 PM TECHNIQUE: Multiplanar multisequence MRI of the head/brain was performed without the administration of intravenous contrast. COMPARISON: CTA head neck and head CT 09/10/2024. CLINICAL HISTORY: Stroke, follow up. FINDINGS: BRAIN AND VENTRICLES: Acute infarct in the posterior limb of the right internal capsule seen on axial image 30 series 2. No acute hemorrhage. No significant mass effect. No midline shift. No hydrocephalus. The sella is unremarkable. Normal  flow voids. ORBITS: No acute abnormality. SINUSES AND MASTOIDS: No acute abnormality. BONES AND SOFT TISSUES: Normal marrow signal. No acute soft tissue abnormality. IMPRESSION: 1. Acute infarct in the posterior limb of the right internal capsule. 2. No acute hemorrhage or significant mass effect. Electronically signed by: Ryan Chess MD 09/10/2024 01:13 PM EDT RP Workstation: [id number]   CT ANGIO HEAD NECK W WO CM Result Date: 09/10/2024 EXAM: CTA HEAD AND NECK WITH AND WITHOUT 09/10/2024 11:29:00 AM TECHNIQUE: CTA of the head and neck was performed with and without the administration of 75mL iohexol (OMNIPAQUE) 350 MG/ML intravenous contrast. Multiplanar 2D and/or 3D reformatted images are provided for review. Automated exposure control, iterative reconstruction, and/or weight based adjustment of the mA/kV was utilized to reduce the radiation dose to as low as reasonably achievable. Stenosis of the internal carotid arteries measured using NASCET criteria. COMPARISON: None available CLINICAL HISTORY: Neuro deficit, acute, stroke suspected. PT presents with Left leg and arm weakness and dizziness. LKW 0630. Pt observed walking in from car with altered gait. FINDINGS: CTA NECK: AORTIC ARCH AND ARCH VESSELS: Mild atherosclerosis along the aortic arch involving the origins of the Left Vertebral Artery  and Left Subclavian Artery without significant stenosis. The origin of the Brachiocephalic and Left Common Carotid Arteries are patent without significant stenosis. No dissection or arterial injury. CERVICAL CAROTID ARTERIES: The Right Carotid Artery is patent from the origin to the skull base. Mild atherosclerosis along the Common Carotid Artery. Mixed atherosclerotic plaque at the Right Carotid Bifurcation without hemodynamically significant stenosis. The Left Carotid Artery is patent from the origin to the skull base. No atherosclerosis along the distal Common Carotid Artery. Mixed atherosclerotic plaque at the Left Carotid Bifurcation without hemodynamically significant stenosis. No dissection or arterial injury. CERVICAL VERTEBRAL ARTERIES: The Vertebral Arteries are patent from the origins to the vertebrobasilar confluence. The Left Vertebral Artery originates on the aortic arch. Atherosclerosis involves the bilateral Vertebral Artery origins. The Right Vertebral Artery is dominant. No dissection, arterial injury, or significant stenosis. LUNGS AND MEDIASTINUM: Unremarkable. SOFT TISSUES: No acute abnormality. BONES: Degenerative changes in the visualized spine. CTA HEAD: ANTERIOR CIRCULATION: Intracranial Internal Carotid Arteries are patent bilaterally. Atherosclerosis of the carotid siphons. There is mild stenosis of the Right Cavernous ICA. The Anterior Cerebral Arteries are patent without significant stenosis. The Middle Cerebral Arteries are patent bilaterally without significant stenosis. No aneurysm. POSTERIOR CIRCULATION: The Posterior Cerebral Arteries are patent without significant stenosis. The Basilar Artery is patent without significant stenosis. The intracranial Vertebral Arteries are patent without significant stenosis. No aneurysm. OTHER: No dural venous sinus thrombosis on this non-dedicated study. IMPRESSION: 1. No large vessel occlusion or aneurysm in the head or neck. 2. Atherosclerosis of  the carotid siphons with mild stenosis of the right cavernous ICA. 3. Mixed atherosclerotic plaque at the right and left carotid bifurcations without hemodynamically significant stenosis. 4. Mild atherosclerosis involving the aortic arch origins of the left vertebral and left subclavian arteries without significant stenosis. Electronically signed by: Donnice Mania MD 09/10/2024 11:46 AM EDT RP Workstation: [id number]   CT HEAD CODE STROKE WO CONTRAST Result Date: 09/10/2024 EXAM: CT HEAD WITHOUT 09/10/2024 10:28:05 AM TECHNIQUE: CT of the head was performed without the administration of intravenous contrast. Automated exposure control, iterative reconstruction, and/or weight based adjustment of the mA/kV was utilized to reduce the radiation dose to as low as reasonably achievable. COMPARISON: None available. CLINICAL HISTORY: Neuro deficit, acute, stroke suspected. Table formatting from the original note was not included.; Non con.  Code stroke. ; PT presents with Left leg and arm weakness and dizziness. LKW 0630. Pt observed walking in from car with altered gait. FINDINGS: BRAIN AND VENTRICLES: No acute intracranial hemorrhage. No mass effect or midline shift. No extra-axial fluid collection. No evidence of acute infarct. No hydrocephalus. ORBITS: No acute abnormality. SINUSES AND MASTOIDS: No acute abnormality. SOFT TISSUES AND SKULL: No acute skull fracture. No acute soft tissue abnormality. [place] Stroke Program Early CT Score (ASPECTS) ----- Ganglionic (caudate, IC, lentiform nucleus, insula, M1-M3): 7 Supraganglionic (M4-M6): 3 Total: 10 IMPRESSION: 1. No acute intracranial abnormality. ASPECTS: 10. These results were communicated to Dr. Vanessa at 10:35 AM on 09/10/2024 by secure text page via the [facility] messaging system. Electronically signed by: Ryan Chess MD 09/10/2024 10:36 AM EDT RP Workstation: [id number]     PHYSICAL EXAM  Temp:  [97.6 F (36.4 C)-98 F (36.7 C)] 97.6 F (36.4 C)  (10/25 1134) Pulse Rate:  [74-77] 76 (10/25 1134) Resp:  [17-20] 20 (10/25 1134) BP: (154-173)/(89-105) 166/97 (10/25 1134) SpO2:  [94 %-97 %] 94 % (10/25 1134)  General - Well nourished, well developed, in no apparent distress.  Ophthalmologic - fundi not visualized due to noncooperation.  Cardiovascular - Regular rhythm and rate.  Mental Status -  Level of arousal and orientation to time, place, and person were intact. Language including expression, naming, repetition, comprehension was assessed and found intact. Attention span and concentration were normal. Recent and remote memory were intact. Fund of Knowledge was assessed and was intact.  Cranial Nerves II - XII - II - Visual field intact OU. III, IV, VI - Extraocular movements intact. V - Facial sensation intact bilaterally. VII - Facial movement intact bilaterally. VIII - Hearing & vestibular intact bilaterally. X - Palate elevates symmetrically. XI - Chin turning & shoulder shrug intact bilaterally. XII - Tongue protrusion intact.  Motor Strength - The patient's strength was normal in all extremities and pronator drift was absent.  Bulk was normal and fasciculations were absent.   Motor Tone - Muscle tone was assessed at the neck and appendages and was normal.  Reflexes - The patient's reflexes were symmetrical in all extremities and he had no pathological reflexes.  Sensory - Light touch, temperature/pinprick were assessed and were symmetrical.    Coordination - The patient had normal movements in the hands and feet with no ataxia or dysmetria.  Tremor was absent.  Gait and Station - deferred.   ASSESSMENT/PLAN Mr. DIETRICH SAMUELSON is a 63 y.o. male with history of hypertension, syncope admitted for left leg weakness, disorientation and imbalance. No TNK given due to outside window.    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 CT no acute abnormality CT head and neck showed  intracranial and extracranial atherosclerosis without significant stenosis MRI right PLIC infarct 2D Echo EF 55 to 60%, no PFO LDL 173 HgbA1c 5.3 UDS positive for THC Lovenox for VTE prophylaxis No antithrombotic prior to admission, now on aspirin 81 mg daily and clopidogrel 75 mg daily DAPT for 3 weeks and then aspirin alone Patient counseled to be compliant with his antithrombotic medications Ongoing aggressive stroke risk factor management Therapy recommendations: None Disposition: Home  Hypertension High on presentation Now on low-dose losartan Long term BP goal normotensive Educated on home BP monitoring and close PCP follow-up  Hyperlipidemia Home meds: None LDL 173, goal < 70 Now on Lipitor 80 Continue statin at discharge  THC abuse UDS positive for [facility] Cessation education  provided  Other Stroke Risk Factors   Other Active Problems History of syncope  Hospital day # 0  Neurology will sign off. Please call with questions. Pt will follow up with stroke clinic NP at [facility] in about 4 weeks. Thanks for the consult.  Ary Cummins, MD PhD Stroke Neurology 09/11/2024 4:40 PM    To contact Stroke Continuity provider, please refer to [email or web address]. After hours, contact General Neurology

## 2024-09-11 NOTE — Discharge Summary (Signed)
**Note Steven-Identified via Obfuscation**  Physician Discharge Summary   Steven Bender FMW:[id number] DOB: [birth year] DOA: 09/10/2024  PCP: Steven Cuba, [name], MD  Admit date: 09/10/2024 Discharge date: 09/11/2024  Admitted From: Home Disposition:  Home Discharging physician: Alm Apo, MD Barriers to discharge: none  Recommendations at discharge: Follow up BP log; adjust regimen further as necessary Encourage ongoing lifestyle modification (diet, wt loss, exercise)   Discharge Condition: stable CODE STATUS: Full  Diet recommendation:  Diet Orders (From admission, onward)     Start     Ordered   09/11/24 0000  Diet - low sodium heart healthy        09/11/24 1631   09/10/24 1125  Diet Heart Room service appropriate? Yes; Fluid consistency: Thin  Diet effective now       Question Answer Comment  Room service appropriate? Yes   Fluid consistency: Thin      09/10/24 1126            Hospital Course: CHRLES Bender is a 63 y.o. male with medical history significant of hypertension (not on meds) and syncope presented with sudden onset left-sided weakness and dizziness.  He developed acute onset of left leg and arm weakness with some associated dizziness. Due to persistence of symptoms, he presented for further evaluation.  He was admitted for stroke workup. CT head unremarkable.  CT angio head/neck negative for LVO.  Mild stenosis of right cavernous ICA and mild atherosclerotic plaque at the right and left carotid bifurcations without hemodynamically significant stenosis. 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 Echo obtained with normal EF, 55 to 60%, no RWMA, grade 1 DD. A1c 5.3%, LDL 173.  He was on no home medications prior to hospitalization. He was started on aspirin and Plavix with recommendations per neurology for 21 days therapy followed by monotherapy aspirin. After permissive hypertension, he was also started on low-dose losartan for blood  pressure control with recommendations for outpatient follow-up with PCP for further management. Lipitor 80 mg daily also initiated for risk factor modification and hyperlipidemia.  He was evaluated by PT with no outpatient needs indicated nor any OT or SLP needs. He was considered safe for discharging home.    The patient's acute and chronic medical conditions were treated accordingly. On day of discharge, patient was felt deemed stable for discharge. Patient/family member advised to call PCP or come back to ER if needed.   Principal Diagnosis: CVA (cerebral vascular accident) [facility])  Discharge Diagnoses: Active Hospital Problems   Diagnosis Date Noted   CVA (cerebral vascular accident) (HCC) 09/10/2024    Priority: 1.   Elevated blood pressure reading 09/10/2024    Priority: 2.   Hyperlipidemia 09/10/2024    Priority: 3.    Resolved Hospital Problems  No resolved problems to display.     Discharge Instructions     Diet - low sodium heart healthy   Complete by: As directed    Increase activity slowly   Complete by: As directed       Allergies as of 09/11/2024       Reactions   Bovine (beef) Protein-containing Drug Products Nausea And Vomiting        Medication List     STOP taking these medications    Shingrix  injection Generic drug: Zoster Vaccine Adjuvanted       TAKE these medications    aspirin EC 81 MG tablet Take 1 tablet (81 mg total) by mouth daily. Swallow whole. Start taking  on: September 12, 2024   atorvastatin 80 MG tablet Commonly known as: LIPITOR Take 1 tablet (80 mg total) by mouth every evening.   clopidogrel 75 MG tablet Commonly known as: PLAVIX Take 1 tablet (75 mg total) by mouth daily for 19 days. Start taking on: September 12, 2024   losartan 25 MG tablet Commonly known as: COZAAR Take 1 tablet (25 mg total) by mouth daily. Start taking on: September 12, 2024        Follow-up Information     Steven Cuba, Steven PARAS, MD.  Schedule an appointment as soon as possible for a visit in 1 week(s).   Specialty: Family Medicine Contact information: [address] [place] [address] [phone]                Allergies  Allergen Reactions   Bovine (Beef) Protein-Containing Drug Products Nausea And Vomiting    Consultations: Neurology  Procedures:   Discharge Exam: BP (!) 166/97 (BP Location: Left Arm)   Pulse 76   Temp 97.6 F (36.4 C) (Oral)   Resp 20   Ht 5' 10 (1.778 m)   Wt 94.2 kg   SpO2 94%   BMI 29.80 kg/m  Physical Exam Constitutional:      General: He is not in acute distress.    Appearance: Normal appearance.  HENT:     Head: Normocephalic and atraumatic.     Mouth/Throat:     Mouth: Mucous membranes are moist.  Eyes:     Extraocular Movements: Extraocular movements intact.  Cardiovascular:     Rate and Rhythm: Normal rate and regular rhythm.  Pulmonary:     Effort: Pulmonary effort is normal. No respiratory distress.     Breath sounds: Normal breath sounds. No wheezing.  Abdominal:     General: Bowel sounds are normal. There is no distension.     Palpations: Abdomen is soft.     Tenderness: There is no abdominal tenderness.  Musculoskeletal:        General: Normal range of motion.     Cervical back: Normal range of motion and neck supple.  Skin:    General: Skin is warm and dry.  Neurological:     General: No focal deficit present.     Mental Status: He is alert.  Psychiatric:        Mood and Affect: Mood normal.        Behavior: Behavior normal.      The results of significant diagnostics from this hospitalization (including imaging, microbiology, ancillary and laboratory) are listed below for reference.   Microbiology: No results found for this or any previous visit (from the past 240 hours).   Labs: BNP (last 3 results) No results for input(s): BNP in the last 8760 hours. Basic Metabolic Panel: Recent Labs  Lab 09/10/24 1023 09/11/24 0204   NA 137  140 133*  K 4.3  4.4 3.9  CL 102  102 100  CO2 25 23  GLUCOSE 123*  121* 99  BUN 11  14 11   CREATININE 0.95  0.90 0.86  CALCIUM 9.6 9.1   Liver Function Tests: Recent Labs  Lab 09/10/24 1023  AST 24  ALT 20  ALKPHOS 77  BILITOT 0.8  PROT 7.9  ALBUMIN 4.3   No results for input(s): LIPASE, AMYLASE in the last 168 hours. No results for input(s): AMMONIA in the last 168 hours. CBC: Recent Labs  Lab 09/10/24 1023 09/11/24 0204  WBC 8.2 7.6  NEUTROABS 5.5  --  HGB 16.5  17.7* 15.2  HCT 50.6  52.0 45.6  MCV 92.5 90.3  PLT 261 249   Cardiac Enzymes: No results for input(s): CKTOTAL, CKMB, CKMBINDEX, TROPONINI in the last 168 hours. BNP: Invalid input(s): POCBNP CBG: Recent Labs  Lab 09/10/24 1018  GLUCAP 125*   D-Dimer No results for input(s): DDIMER in the last 72 hours. Hgb A1c Recent Labs    09/10/24 1023  HGBA1C 5.3   Lipid Profile Recent Labs    09/10/24 1023  CHOL 247*  HDL 50  LDLCALC 173*  TRIG 119  CHOLHDL 4.9   Thyroid function studies No results for input(s): TSH, T4TOTAL, T3FREE, THYROIDAB in the last 72 hours.  Invalid input(s): FREET3 Anemia work up No results for input(s): VITAMINB12, FOLATE, FERRITIN, TIBC, IRON, RETICCTPCT in the last 72 hours. Urinalysis    Component Value Date/Time   BILIRUBINUR Negative 06/19/2022 0957   PROTEINUR Negative 06/19/2022 0957   UROBILINOGEN 0.2 06/19/2022 0957   NITRITE Negative 06/19/2022 0957   LEUKOCYTESUR Trace (A) 06/19/2022 0957   Sepsis Labs Recent Labs  Lab 09/10/24 1023 09/11/24 0204  WBC 8.2 7.6   Microbiology No results found for this or any previous visit (from the past 240 hours).  Procedures/Studies: ECHOCARDIOGRAM COMPLETE Result Date: 09/10/2024    ECHOCARDIOGRAM REPORT   Patient Name:   ODIE EDMONDS Date of Exam: 09/10/2024 Medical Rec #:  [id number]         Height:       70.0 in Accession #:    [id number]         Weight:       207.7 lb Date of Birth:  1960/11/25         BSA:          2.121 m Patient Age:    63 years          BP:           163/78 mmHg Patient Gender: M                 HR:           68 bpm. Exam Location:  Inpatient Procedure: 2D Echo, Color Doppler and Cardiac Doppler (Both Spectral and Color            Flow Doppler were utilized during procedure). Indications:    Stroke I63.9  History:        Patient has no prior history of Echocardiogram examinations.                 Abnormal ECG, Stroke; Signs/Symptoms:Syncope.  Sonographer:    BERNARDA ROCKS Referring Phys: [id number] [facility] KHALIQDINA IMPRESSIONS  1. Left ventricular ejection fraction, by estimation, is 55 to 60%. The left ventricle has normal function. The left ventricle has no regional wall motion abnormalities. Left ventricular diastolic parameters are consistent with Grade I diastolic dysfunction (impaired relaxation).  2. Right ventricular systolic function is normal. The right ventricular size is normal.  3. The mitral valve is degenerative. Trivial mitral valve regurgitation. No evidence of mitral stenosis.  4. The aortic valve is tricuspid. There is moderate calcification of the aortic valve. Aortic valve regurgitation is not visualized. Aortic valve sclerosis/calcification is present, without any evidence of aortic stenosis.  5. The inferior vena cava is normal in size with greater than 50% respiratory variability, suggesting right atrial pressure of 3 mmHg. FINDINGS  Left Ventricle: Left ventricular ejection fraction, by estimation, is 55 to 60%. The left ventricle has  normal function. The left ventricle has no regional wall motion abnormalities. The left ventricular internal cavity size was normal in size. There is  no left ventricular hypertrophy. Left ventricular diastolic parameters are consistent with Grade I diastolic dysfunction (impaired relaxation). Right Ventricle: The right ventricular size is normal. No increase in right  ventricular wall thickness. Right ventricular systolic function is normal. Left Atrium: Left atrial size was normal in size. Right Atrium: Right atrial size was normal in size. Pericardium: There is no evidence of pericardial effusion. Mitral Valve: The mitral valve is degenerative in appearance. Mild mitral annular calcification. Trivial mitral valve regurgitation. No evidence of mitral valve stenosis. MV peak gradient, 2.2 mmHg. The mean mitral valve gradient is 1.0 mmHg. Tricuspid Valve: The tricuspid valve is normal in structure. Tricuspid valve regurgitation is trivial. No evidence of tricuspid stenosis. Aortic Valve: The aortic valve is tricuspid. There is moderate calcification of the aortic valve. Aortic valve regurgitation is not visualized. Aortic valve sclerosis/calcification is present, without any evidence of aortic stenosis. Aortic valve mean gradient measures 2.0 mmHg. Aortic valve peak gradient measures 5.6 mmHg. Aortic valve area, by VTI measures 3.18 cm. Pulmonic Valve: The pulmonic valve was normal in structure. Pulmonic valve regurgitation is not visualized. No evidence of pulmonic stenosis. Aorta: The aortic root is normal in size and structure. Venous: The inferior vena cava is normal in size with greater than 50% respiratory variability, suggesting right atrial pressure of 3 mmHg. IAS/Shunts: No atrial level shunt detected by color flow Doppler.  LEFT VENTRICLE PLAX 2D LVIDd:         4.90 cm      Diastology LVIDs:         3.30 cm      LV e' medial:    10.30 cm/s LV PW:         0.90 cm      LV E/e' medial:  7.5 LV IVS:        0.80 cm      LV e' lateral:   13.70 cm/s LVOT diam:     2.00 cm      LV E/e' lateral: 5.6 LV SV:         75 LV SV Index:   35 LVOT Area:     3.14 cm  LV Volumes (MOD) LV vol d, MOD A2C: 173.0 ml LV vol d, MOD A4C: 159.0 ml LV vol s, MOD A2C: 75.5 ml LV vol s, MOD A4C: 68.0 ml LV SV MOD A2C:     97.5 ml LV SV MOD A4C:     159.0 ml LV SV MOD BP:      96.4 ml RIGHT  VENTRICLE             IVC RV Basal diam:  3.90 cm     IVC diam: 1.90 cm RV S prime:     13.60 cm/s TAPSE (M-mode): 3.0 cm LEFT ATRIUM             Index        RIGHT ATRIUM           Index LA diam:        3.80 cm 1.79 cm/m   RA Area:     15.80 cm LA Vol (A2C):   58.0 ml 27.35 ml/m  RA Volume:   34.50 ml  16.27 ml/m LA Vol (A4C):   49.6 ml 23.39 ml/m LA Biplane Vol: 55.3 ml 26.07 ml/m  AORTIC VALVE  PULMONIC VALVE AV Area (Vmax):    3.14 cm     PV Vmax:       1.15 m/s AV Area (Vmean):   3.22 cm     PV Peak grad:  5.3 mmHg AV Area (VTI):     3.18 cm AV Vmax:           118.00 cm/s AV Vmean:          69.700 cm/s AV VTI:            0.235 m AV Peak Grad:      5.6 mmHg AV Mean Grad:      2.0 mmHg LVOT Vmax:         118.00 cm/s LVOT Vmean:        71.500 cm/s LVOT VTI:          0.238 m LVOT/AV VTI ratio: 1.01  AORTA Ao Root diam: 3.30 cm Ao Asc diam:  3.40 cm MITRAL VALVE MV Area (PHT): 2.93 cm    SHUNTS MV Area VTI:   3.17 cm    Systemic VTI:  0.24 m MV Peak grad:  2.2 mmHg    Systemic Diam: 2.00 cm MV Mean grad:  1.0 mmHg MV Vmax:       0.75 m/s MV Vmean:      45.1 cm/s MV Decel Time: 259 msec MV E velocity: 77.10 cm/s MV A velocity: 58.70 cm/s MV E/A ratio:  1.31 Toribio Fuel MD Electronically signed by Toribio Fuel MD Signature Date/Time: 09/10/2024/5:43:56 PM    Final    MR BRAIN WO CONTRAST Result Date: 09/10/2024 EXAM: MRI BRAIN WITHOUT CONTRAST 09/10/2024 12:56:05 PM TECHNIQUE: Multiplanar multisequence MRI of the head/brain was performed without the administration of intravenous contrast. COMPARISON: CTA head neck and head CT 09/10/2024. CLINICAL HISTORY: Stroke, follow up. FINDINGS: BRAIN AND VENTRICLES: Acute infarct in the posterior limb of the right internal capsule seen on axial image 30 series 2. No acute hemorrhage. No significant mass effect. No midline shift. No hydrocephalus. The sella is unremarkable. Normal flow voids. ORBITS: No acute abnormality. SINUSES AND  MASTOIDS: No acute abnormality. BONES AND SOFT TISSUES: Normal marrow signal. No acute soft tissue abnormality. IMPRESSION: 1. Acute infarct in the posterior limb of the right internal capsule. 2. No acute hemorrhage or significant mass effect. Electronically signed by: Ryan Chess MD 09/10/2024 01:13 PM EDT RP Workstation: [id number]   CT ANGIO HEAD NECK W WO CM Result Date: 09/10/2024 EXAM: CTA HEAD AND NECK WITH AND WITHOUT 09/10/2024 11:29:00 AM TECHNIQUE: CTA of the head and neck was performed with and without the administration of 75mL iohexol (OMNIPAQUE) 350 MG/ML intravenous contrast. Multiplanar 2D and/or 3D reformatted images are provided for review. Automated exposure control, iterative reconstruction, and/or weight based adjustment of the mA/kV was utilized to reduce the radiation dose to as low as reasonably achievable. Stenosis of the internal carotid arteries measured using NASCET criteria. COMPARISON: None available CLINICAL HISTORY: Neuro deficit, acute, stroke suspected. PT presents with Left leg and arm weakness and dizziness. LKW 0630. Pt observed walking in from car with altered gait. FINDINGS: CTA NECK: AORTIC ARCH AND ARCH VESSELS: Mild atherosclerosis along the aortic arch involving the origins of the Left Vertebral Artery and Left Subclavian Artery without significant stenosis. The origin of the Brachiocephalic and Left Common Carotid Arteries are patent without significant stenosis. No dissection or arterial injury. CERVICAL CAROTID ARTERIES: The Right Carotid Artery is patent from the origin to the skull base. Mild atherosclerosis along the  Common Carotid Artery. Mixed atherosclerotic plaque at the Right Carotid Bifurcation without hemodynamically significant stenosis. The Left Carotid Artery is patent from the origin to the skull base. No atherosclerosis along the distal Common Carotid Artery. Mixed atherosclerotic plaque at the Left Carotid Bifurcation without hemodynamically  significant stenosis. No dissection or arterial injury. CERVICAL VERTEBRAL ARTERIES: The Vertebral Arteries are patent from the origins to the vertebrobasilar confluence. The Left Vertebral Artery originates on the aortic arch. Atherosclerosis involves the bilateral Vertebral Artery origins. The Right Vertebral Artery is dominant. No dissection, arterial injury, or significant stenosis. LUNGS AND MEDIASTINUM: Unremarkable. SOFT TISSUES: No acute abnormality. BONES: Degenerative changes in the visualized spine. CTA HEAD: ANTERIOR CIRCULATION: Intracranial Internal Carotid Arteries are patent bilaterally. Atherosclerosis of the carotid siphons. There is mild stenosis of the Right Cavernous ICA. The Anterior Cerebral Arteries are patent without significant stenosis. The Middle Cerebral Arteries are patent bilaterally without significant stenosis. No aneurysm. POSTERIOR CIRCULATION: The Posterior Cerebral Arteries are patent without significant stenosis. The Basilar Artery is patent without significant stenosis. The intracranial Vertebral Arteries are patent without significant stenosis. No aneurysm. OTHER: No dural venous sinus thrombosis on this non-dedicated study. IMPRESSION: 1. No large vessel occlusion or aneurysm in the head or neck. 2. Atherosclerosis of the carotid siphons with mild stenosis of the right cavernous ICA. 3. Mixed atherosclerotic plaque at the right and left carotid bifurcations without hemodynamically significant stenosis. 4. Mild atherosclerosis involving the aortic arch origins of the left vertebral and left subclavian arteries without significant stenosis. Electronically signed by: Donnice Mania MD 09/10/2024 11:46 AM EDT RP Workstation: [id number]   CT HEAD CODE STROKE WO CONTRAST Result Date: 09/10/2024 EXAM: CT HEAD WITHOUT 09/10/2024 10:28:05 AM TECHNIQUE: CT of the head was performed without the administration of intravenous contrast. Automated exposure control, iterative  reconstruction, and/or weight based adjustment of the mA/kV was utilized to reduce the radiation dose to as low as reasonably achievable. COMPARISON: None available. CLINICAL HISTORY: Neuro deficit, acute, stroke suspected. Table formatting from the original note was not included.; Non con. Code stroke. ; PT presents with Left leg and arm weakness and dizziness. LKW 0630. Pt observed walking in from car with altered gait. FINDINGS: BRAIN AND VENTRICLES: No acute intracranial hemorrhage. No mass effect or midline shift. No extra-axial fluid collection. No evidence of acute infarct. No hydrocephalus. ORBITS: No acute abnormality. SINUSES AND MASTOIDS: No acute abnormality. SOFT TISSUES AND SKULL: No acute skull fracture. No acute soft tissue abnormality. [place] Stroke Program Early CT Score (ASPECTS) ----- Ganglionic (caudate, IC, lentiform nucleus, insula, M1-M3): 7 Supraganglionic (M4-M6): 3 Total: 10 IMPRESSION: 1. No acute intracranial abnormality. ASPECTS: 10. These results were communicated to Dr. Vanessa at 10:35 AM on 09/10/2024 by secure text page via the [facility] messaging system. Electronically signed by: Ryan Chess MD 09/10/2024 10:36 AM EDT RP Workstation: [id number]     Time coordinating discharge: Over 30 minutes    Alm Apo, MD  [facility] 09/11/2024, 4:33 PM

## 2024-09-11 NOTE — Progress Notes (Signed)
 SLP Cancellation Note  Patient Details Name: Steven Bender MRN: [id number] DOB: [birth year]   Cancelled treatment:       Reason Eval/Treat Not Completed: SLP screened, no needs identified, will sign off (Pt and wife report a full return to cognitive and speech-language baseline. A&Ox4, speech clear and without dysarthria or aphasia. No needs, SLP signing off.)   Manuelita Blew M.S. CCC-SLP

## 2024-09-11 NOTE — Plan of Care (Signed)
 Problem: Education: Goal: Knowledge of disease or condition will improve 09/11/2024 1641 by Joshua Zorita CROME, RN Outcome: Adequate for Discharge 09/11/2024 1609 by Joshua Zorita CROME, RN Outcome: Progressing Goal: Knowledge of secondary prevention will improve (MUST DOCUMENT ALL) 09/11/2024 1641 by Joshua Zorita CROME, RN Outcome: Adequate for Discharge 09/11/2024 1609 by Joshua Zorita CROME, RN Outcome: Progressing Goal: Knowledge of patient specific risk factors will improve (DELETE if not current risk factor) 09/11/2024 1641 by Joshua Zorita CROME, RN Outcome: Adequate for Discharge 09/11/2024 1609 by Joshua Zorita CROME, RN Outcome: Progressing   Problem: Ischemic Stroke/TIA Tissue Perfusion: Goal: Complications of ischemic stroke/TIA will be minimized 09/11/2024 1641 by Joshua Zorita CROME, RN Outcome: Adequate for Discharge 09/11/2024 1609 by Joshua Zorita CROME, RN Outcome: Progressing   Problem: Coping: Goal: Will verbalize positive feelings about self 09/11/2024 1641 by Joshua Zorita CROME, RN Outcome: Adequate for Discharge 09/11/2024 1609 by Joshua Zorita CROME, RN Outcome: Progressing Goal: Will identify appropriate support needs 09/11/2024 1641 by Joshua Zorita CROME, RN Outcome: Adequate for Discharge 09/11/2024 1609 by Joshua Zorita CROME, RN Outcome: Progressing   Problem: Health Behavior/Discharge Planning: Goal: Ability to manage health-related needs will improve 09/11/2024 1641 by Joshua Zorita CROME, RN Outcome: Adequate for Discharge 09/11/2024 1609 by Joshua Zorita CROME, RN Outcome: Progressing Goal: Goals will be collaboratively established with patient/family 09/11/2024 1641 by Joshua Zorita CROME, RN Outcome: Adequate for Discharge 09/11/2024 1609 by Joshua Zorita CROME, RN Outcome: Progressing   Problem: Self-Care: Goal: Ability to participate in self-care as condition permits will improve 09/11/2024 1641 by Joshua Zorita CROME, RN Outcome: Adequate for Discharge 09/11/2024 1609 by Joshua Zorita CROME, RN Outcome:  Progressing Goal: Verbalization of feelings and concerns over difficulty with self-care will improve 09/11/2024 1641 by Joshua Zorita CROME, RN Outcome: Adequate for Discharge 09/11/2024 1609 by Joshua Zorita CROME, RN Outcome: Progressing Goal: Ability to communicate needs accurately will improve 09/11/2024 1641 by Joshua Zorita CROME, RN Outcome: Adequate for Discharge 09/11/2024 1609 by Joshua Zorita CROME, RN Outcome: Progressing   Problem: Nutrition: Goal: Risk of aspiration will decrease 09/11/2024 1641 by Joshua Zorita CROME, RN Outcome: Adequate for Discharge 09/11/2024 1609 by Joshua Zorita CROME, RN Outcome: Progressing Goal: Dietary intake will improve 09/11/2024 1641 by Joshua Zorita CROME, RN Outcome: Adequate for Discharge 09/11/2024 1609 by Joshua Zorita CROME, RN Outcome: Progressing   Problem: Education: Goal: Knowledge of General Education information will improve Description: Including pain rating scale, medication(s)/side effects and non-pharmacologic comfort measures 09/11/2024 1641 by Joshua Zorita CROME, RN Outcome: Adequate for Discharge 09/11/2024 1609 by Joshua Zorita CROME, RN Outcome: Progressing   Problem: Health Behavior/Discharge Planning: Goal: Ability to manage health-related needs will improve 09/11/2024 1641 by Joshua Zorita CROME, RN Outcome: Adequate for Discharge 09/11/2024 1609 by Joshua Zorita CROME, RN Outcome: Progressing   Problem: Clinical Measurements: Goal: Ability to maintain clinical measurements within normal limits will improve 09/11/2024 1641 by Joshua Zorita CROME, RN Outcome: Adequate for Discharge 09/11/2024 1609 by Joshua Zorita CROME, RN Outcome: Progressing Goal: Will remain free from infection 09/11/2024 1641 by Joshua Zorita CROME, RN Outcome: Adequate for Discharge 09/11/2024 1609 by Joshua Zorita CROME, RN Outcome: Progressing Goal: Diagnostic test results will improve 09/11/2024 1641 by Joshua Zorita CROME, RN Outcome: Adequate for Discharge 09/11/2024 1609 by Joshua Zorita CROME,  RN Outcome: Progressing Goal: Respiratory complications will improve 09/11/2024 1641 by Joshua Zorita CROME, RN Outcome: Adequate for Discharge 09/11/2024 1609 by Joshua Zorita CROME, RN Outcome: Progressing Goal: Cardiovascular complication will be avoided 09/11/2024 1641 by  Joshua Zorita CROME, RN Outcome: Adequate for Discharge 09/11/2024 1609 by Joshua Zorita CROME, RN Outcome: Progressing   Problem: Activity: Goal: Risk for activity intolerance will decrease 09/11/2024 1641 by Joshua Zorita CROME, RN Outcome: Adequate for Discharge 09/11/2024 1609 by Joshua Zorita CROME, RN Outcome: Progressing   Problem: Nutrition: Goal: Adequate nutrition will be maintained 09/11/2024 1641 by Joshua Zorita CROME, RN Outcome: Adequate for Discharge 09/11/2024 1609 by Joshua Zorita CROME, RN Outcome: Progressing   Problem: Coping: Goal: Level of anxiety will decrease 09/11/2024 1641 by Joshua Zorita CROME, RN Outcome: Adequate for Discharge 09/11/2024 1609 by Joshua Zorita CROME, RN Outcome: Progressing   Problem: Elimination: Goal: Will not experience complications related to bowel motility 09/11/2024 1641 by Joshua Zorita CROME, RN Outcome: Adequate for Discharge 09/11/2024 1609 by Joshua Zorita CROME, RN Outcome: Progressing Goal: Will not experience complications related to urinary retention 09/11/2024 1641 by Joshua Zorita CROME, RN Outcome: Adequate for Discharge 09/11/2024 1609 by Joshua Zorita CROME, RN Outcome: Progressing   Problem: Pain Managment: Goal: General experience of comfort will improve and/or be controlled 09/11/2024 1641 by Joshua Zorita CROME, RN Outcome: Adequate for Discharge 09/11/2024 1609 by Joshua Zorita CROME, RN Outcome: Progressing   Problem: Safety: Goal: Ability to remain free from injury will improve 09/11/2024 1641 by Joshua Zorita CROME, RN Outcome: Adequate for Discharge 09/11/2024 1609 by Joshua Zorita CROME, RN Outcome: Progressing   Problem: Skin Integrity: Goal: Risk for impaired skin integrity will  decrease 09/11/2024 1641 by Joshua Zorita CROME, RN Outcome: Adequate for Discharge 09/11/2024 1609 by Joshua Zorita CROME, RN Outcome: Progressing

## 2024-09-11 NOTE — Plan of Care (Signed)

## 2024-09-11 NOTE — Progress Notes (Signed)
 Pt w/o pulmonary hx; denies any sob and questions indication for neb; BBS essn clear. Albuterol neb changed to [id number] per RT protocol assessment.

## 2024-09-11 NOTE — Plan of Care (Signed)
 Problem: Education: Goal: Knowledge of disease or condition will improve 09/11/2024 1641 by Joshua Zorita CROME, RN Outcome: Adequate for Discharge 09/11/2024 1641 by Joshua Zorita CROME, RN Outcome: Adequate for Discharge 09/11/2024 1609 by Joshua Zorita CROME, RN Outcome: Progressing Goal: Knowledge of secondary prevention will improve (MUST DOCUMENT ALL) 09/11/2024 1641 by Joshua Zorita CROME, RN Outcome: Adequate for Discharge 09/11/2024 1641 by Joshua Zorita CROME, RN Outcome: Adequate for Discharge 09/11/2024 1609 by Joshua Zorita CROME, RN Outcome: Progressing Goal: Knowledge of patient specific risk factors will improve (DELETE if not current risk factor) 09/11/2024 1641 by Joshua Zorita CROME, RN Outcome: Adequate for Discharge 09/11/2024 1641 by Joshua Zorita CROME, RN Outcome: Adequate for Discharge 09/11/2024 1609 by Joshua Zorita CROME, RN Outcome: Progressing   Problem: Ischemic Stroke/TIA Tissue Perfusion: Goal: Complications of ischemic stroke/TIA will be minimized 09/11/2024 1641 by Joshua Zorita CROME, RN Outcome: Adequate for Discharge 09/11/2024 1641 by Joshua Zorita CROME, RN Outcome: Adequate for Discharge 09/11/2024 1609 by Joshua Zorita CROME, RN Outcome: Progressing   Problem: Coping: Goal: Will verbalize positive feelings about self 09/11/2024 1641 by Joshua Zorita CROME, RN Outcome: Adequate for Discharge 09/11/2024 1641 by Joshua Zorita CROME, RN Outcome: Adequate for Discharge 09/11/2024 1609 by Joshua Zorita CROME, RN Outcome: Progressing Goal: Will identify appropriate support needs 09/11/2024 1641 by Joshua Zorita CROME, RN Outcome: Adequate for Discharge 09/11/2024 1641 by Joshua Zorita CROME, RN Outcome: Adequate for Discharge 09/11/2024 1609 by Joshua Zorita CROME, RN Outcome: Progressing   Problem: Health Behavior/Discharge Planning: Goal: Ability to manage health-related needs will improve 09/11/2024 1641 by Joshua Zorita CROME, RN Outcome: Adequate for Discharge 09/11/2024 1641 by Joshua Zorita CROME, RN Outcome: Adequate  for Discharge 09/11/2024 1609 by Joshua Zorita CROME, RN Outcome: Progressing Goal: Goals will be collaboratively established with patient/family 09/11/2024 1641 by Joshua Zorita CROME, RN Outcome: Adequate for Discharge 09/11/2024 1641 by Joshua Zorita CROME, RN Outcome: Adequate for Discharge 09/11/2024 1609 by Joshua Zorita CROME, RN Outcome: Progressing   Problem: Self-Care: Goal: Ability to participate in self-care as condition permits will improve 09/11/2024 1641 by Joshua Zorita CROME, RN Outcome: Adequate for Discharge 09/11/2024 1641 by Joshua Zorita CROME, RN Outcome: Adequate for Discharge 09/11/2024 1609 by Joshua Zorita CROME, RN Outcome: Progressing Goal: Verbalization of feelings and concerns over difficulty with self-care will improve 09/11/2024 1641 by Joshua Zorita CROME, RN Outcome: Adequate for Discharge 09/11/2024 1641 by Joshua Zorita CROME, RN Outcome: Adequate for Discharge 09/11/2024 1609 by Joshua Zorita CROME, RN Outcome: Progressing Goal: Ability to communicate needs accurately will improve 09/11/2024 1641 by Joshua Zorita CROME, RN Outcome: Adequate for Discharge 09/11/2024 1641 by Joshua Zorita CROME, RN Outcome: Adequate for Discharge 09/11/2024 1609 by Joshua Zorita CROME, RN Outcome: Progressing   Problem: Nutrition: Goal: Risk of aspiration will decrease 09/11/2024 1641 by Joshua Zorita CROME, RN Outcome: Adequate for Discharge 09/11/2024 1641 by Joshua Zorita CROME, RN Outcome: Adequate for Discharge 09/11/2024 1609 by Joshua Zorita CROME, RN Outcome: Progressing Goal: Dietary intake will improve 09/11/2024 1641 by Joshua Zorita CROME, RN Outcome: Adequate for Discharge 09/11/2024 1641 by Joshua Zorita CROME, RN Outcome: Adequate for Discharge 09/11/2024 1609 by Joshua Zorita CROME, RN Outcome: Progressing   Problem: Education: Goal: Knowledge of General Education information will improve Description: Including pain rating scale, medication(s)/side effects and non-pharmacologic comfort measures 09/11/2024 1641 by Joshua Zorita CROME, RN Outcome: Adequate for Discharge 09/11/2024 1641 by Joshua Zorita CROME, RN Outcome: Adequate for Discharge 09/11/2024 1609 by Joshua Zorita CROME, RN Outcome: Progressing  Problem: Health Behavior/Discharge Planning: Goal: Ability to manage health-related needs will improve 09/11/2024 1641 by Joshua Zorita CROME, RN Outcome: Adequate for Discharge 09/11/2024 1641 by Joshua Zorita CROME, RN Outcome: Adequate for Discharge 09/11/2024 1609 by Joshua Zorita CROME, RN Outcome: Progressing   Problem: Clinical Measurements: Goal: Ability to maintain clinical measurements within normal limits will improve 09/11/2024 1641 by Joshua Zorita CROME, RN Outcome: Adequate for Discharge 09/11/2024 1641 by Joshua Zorita CROME, RN Outcome: Adequate for Discharge 09/11/2024 1609 by Joshua Zorita CROME, RN Outcome: Progressing Goal: Will remain free from infection 09/11/2024 1641 by Joshua Zorita CROME, RN Outcome: Adequate for Discharge 09/11/2024 1641 by Joshua Zorita CROME, RN Outcome: Adequate for Discharge 09/11/2024 1609 by Joshua Zorita CROME, RN Outcome: Progressing Goal: Diagnostic test results will improve 09/11/2024 1641 by Joshua Zorita CROME, RN Outcome: Adequate for Discharge 09/11/2024 1641 by Joshua Zorita CROME, RN Outcome: Adequate for Discharge 09/11/2024 1609 by Joshua Zorita CROME, RN Outcome: Progressing Goal: Respiratory complications will improve 09/11/2024 1641 by Joshua Zorita CROME, RN Outcome: Adequate for Discharge 09/11/2024 1641 by Joshua Zorita CROME, RN Outcome: Adequate for Discharge 09/11/2024 1609 by Joshua Zorita CROME, RN Outcome: Progressing Goal: Cardiovascular complication will be avoided 09/11/2024 1641 by Joshua Zorita CROME, RN Outcome: Adequate for Discharge 09/11/2024 1641 by Joshua Zorita CROME, RN Outcome: Adequate for Discharge 09/11/2024 1609 by Joshua Zorita CROME, RN Outcome: Progressing   Problem: Activity: Goal: Risk for activity intolerance will decrease 09/11/2024 1641 by Joshua Zorita CROME, RN Outcome: Adequate  for Discharge 09/11/2024 1641 by Joshua Zorita CROME, RN Outcome: Adequate for Discharge 09/11/2024 1609 by Joshua Zorita CROME, RN Outcome: Progressing   Problem: Nutrition: Goal: Adequate nutrition will be maintained 09/11/2024 1641 by Joshua Zorita CROME, RN Outcome: Adequate for Discharge 09/11/2024 1641 by Joshua Zorita CROME, RN Outcome: Adequate for Discharge 09/11/2024 1609 by Joshua Zorita CROME, RN Outcome: Progressing   Problem: Coping: Goal: Level of anxiety will decrease 09/11/2024 1641 by Joshua Zorita CROME, RN Outcome: Adequate for Discharge 09/11/2024 1641 by Joshua Zorita CROME, RN Outcome: Adequate for Discharge 09/11/2024 1609 by Joshua Zorita CROME, RN Outcome: Progressing   Problem: Elimination: Goal: Will not experience complications related to bowel motility 09/11/2024 1641 by Joshua Zorita CROME, RN Outcome: Adequate for Discharge 09/11/2024 1641 by Joshua Zorita CROME, RN Outcome: Adequate for Discharge 09/11/2024 1609 by Joshua Zorita CROME, RN Outcome: Progressing Goal: Will not experience complications related to urinary retention 09/11/2024 1641 by Joshua Zorita CROME, RN Outcome: Adequate for Discharge 09/11/2024 1641 by Joshua Zorita CROME, RN Outcome: Adequate for Discharge 09/11/2024 1609 by Joshua Zorita CROME, RN Outcome: Progressing   Problem: Pain Managment: Goal: General experience of comfort will improve and/or be controlled 09/11/2024 1641 by Joshua Zorita CROME, RN Outcome: Adequate for Discharge 09/11/2024 1641 by Joshua Zorita CROME, RN Outcome: Adequate for Discharge 09/11/2024 1609 by Joshua Zorita CROME, RN Outcome: Progressing   Problem: Safety: Goal: Ability to remain free from injury will improve 09/11/2024 1641 by Joshua Zorita CROME, RN Outcome: Adequate for Discharge 09/11/2024 1641 by Joshua Zorita CROME, RN Outcome: Adequate for Discharge 09/11/2024 1609 by Joshua Zorita CROME, RN Outcome: Progressing   Problem: Skin Integrity: Goal: Risk for impaired skin integrity will decrease 09/11/2024 1641 by  Joshua Zorita CROME, RN Outcome: Adequate for Discharge 09/11/2024 1641 by Joshua Zorita CROME, RN Outcome: Adequate for Discharge 09/11/2024 1609 by Joshua Zorita CROME, RN Outcome: Progressing

## 2024-09-11 NOTE — Hospital Course (Signed)
 Steven Bender is a 63 y.o. male with medical history significant of hypertension (not on meds) and syncope presented with sudden onset left-sided weakness and dizziness.  He developed acute onset of left leg and arm weakness with some associated dizziness. Due to persistence of symptoms, he presented for further evaluation.  He was admitted for stroke workup. CT head unremarkable.  CT angio head/neck negative for LVO.  Mild stenosis of right cavernous ICA and mild atherosclerotic plaque at the right and left carotid bifurcations without hemodynamically significant stenosis. 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 Echo obtained with normal EF, 55 to 60%, no RWMA, grade 1 DD. A1c 5.3%, LDL 173.  He was on no home medications prior to hospitalization. He was started on aspirin and Plavix with recommendations per neurology for 21 days therapy followed by monotherapy aspirin. After permissive hypertension, he was also started on low-dose losartan for blood pressure control with recommendations for outpatient follow-up with PCP for further management. Lipitor 80 mg daily also initiated for risk factor modification and hyperlipidemia.  He was evaluated by PT with no outpatient needs indicated nor any OT or SLP needs. He was considered safe for discharging home.

## 2024-09-13 ENCOUNTER — Other Ambulatory Visit (HOSPITAL_COMMUNITY): Payer: Self-pay

## 2024-09-17 ENCOUNTER — Telehealth (HOSPITAL_BASED_OUTPATIENT_CLINIC_OR_DEPARTMENT_OTHER): Payer: Self-pay | Admitting: Family Medicine

## 2024-09-17 NOTE — Telephone Encounter (Signed)
 Please advise based off of pt's hospital stay if we can work pt in and if so, when?

## 2024-09-17 NOTE — Telephone Encounter (Signed)
 Copied from CRM #[id number]. Topic: Appointments - Scheduling Inquiry for Clinic >> Sep 17, 2024 12:04 PM Anairis L wrote: Reason for CRM: Pt needs a hospital follow,but nothing available until 09/30/2024. Discharge 09/12/2024

## 2024-09-21 NOTE — Telephone Encounter (Signed)
 Patient called back in checking status for hosp fu from stroke he had from ER. Patient called 10/31 , I let him know of 48-72 hr turnaround, which it tomorrow

## 2024-09-22 NOTE — Telephone Encounter (Signed)
 Attempted to call pt but line just rang and rang and no VM ever kicked in. Have sent pt a mychart message to see if he is able to do 11/12 at 10:50 for HFU. Will await response.

## 2024-09-23 NOTE — Telephone Encounter (Signed)
 Routing to Jenny and Kiana for assistance with trying to get pt scheduled for HFU.

## 2024-09-29 ENCOUNTER — Encounter (HOSPITAL_BASED_OUTPATIENT_CLINIC_OR_DEPARTMENT_OTHER): Payer: Self-pay | Admitting: Family Medicine

## 2024-09-29 ENCOUNTER — Ambulatory Visit (INDEPENDENT_AMBULATORY_CARE_PROVIDER_SITE_OTHER): Admitting: Family Medicine

## 2024-09-29 ENCOUNTER — Other Ambulatory Visit (HOSPITAL_BASED_OUTPATIENT_CLINIC_OR_DEPARTMENT_OTHER): Payer: Self-pay

## 2024-09-29 VITALS — BP 138/73 | HR 83 | Ht 70.0 in | Wt 199.0 lb

## 2024-09-29 DIAGNOSIS — Z0000 Encounter for general adult medical examination without abnormal findings: Secondary | ICD-10-CM

## 2024-09-29 DIAGNOSIS — I10 Essential (primary) hypertension: Secondary | ICD-10-CM | POA: Insufficient documentation

## 2024-09-29 DIAGNOSIS — E785 Hyperlipidemia, unspecified: Secondary | ICD-10-CM

## 2024-09-29 DIAGNOSIS — Z8673 Personal history of transient ischemic attack (TIA), and cerebral infarction without residual deficits: Secondary | ICD-10-CM | POA: Insufficient documentation

## 2024-09-29 MED ORDER — SHINGRIX 50 MCG/0.5ML IM SUSR
0.5000 mL | INTRAMUSCULAR | 0 refills | Status: DC
  Filled 2024-09-29: qty 0.5, 1d supply, fill #0

## 2024-09-29 MED ORDER — LOSARTAN POTASSIUM 25 MG PO TABS
25.0000 mg | ORAL_TABLET | Freq: Every day | ORAL | 1 refills | Status: AC
Start: 2024-09-29 — End: ?

## 2024-09-29 MED ORDER — ATORVASTATIN CALCIUM 80 MG PO TABS: 80.0000 mg | ORAL_TABLET | Freq: Every evening | ORAL | 1 refills | Status: AC

## 2024-09-29 NOTE — Assessment & Plan Note (Signed)
 Recommend continuing with current medication regimen.  He will be completing Plavix this week.  Recommend following up with neurology as scheduled

## 2024-09-29 NOTE — Progress Notes (Signed)
    Procedures performed today:    None.  Independent interpretation of notes and tests performed by another provider:   None.  Brief History, Exam, Impression, and Recommendations:    BP 138/73 (BP Location: Right Arm, Patient Position: Sitting, Cuff Size: Normal)   Pulse 83   Ht 5' 10 (1.778 m)   Wt 199 lb (90.3 kg)   SpO2 96%   BMI 28.55 kg/m   Patient was hospitalized a few weeks ago due to presenting symptoms of sudden onset left-sided weakness and dizziness.  Patient was evaluated for stroke and during evaluation he did have spontaneous resolution of symptoms.  Neurology was consulted while he was in the hospital and he did have labs and imaging completed.  CT scan of head was unremarkable he was noted to have mild stenosis of right cavernous ICA and mild atherosclerotic plaque at the right and left carotid bifurcations without any significant stenosis.  MRI brain did show acute infarct involving posterior limb of the right internal capsule. Patient remained in hospital for 1 night.  He was discharged on ASA 81, atorvastatin, losartan and Plavix.  He was recommended to continue with Plavix for 21 days total and all other medications indefinitely.  He does have appointment with neurology next month. On exam, patient is in no acute distress, vital signs stable.  Cardiovascular exam with regular rate and rhythm, lungs clear to auscultation bilaterally.  Normal gait in office.  Primary hypertension Assessment & Plan: Blood pressure controlled in office today.  Can continue with current medication regimen.  Recommend intermittent monitoring of blood pressure at home, DASH diet   History of CVA (cerebrovascular accident) Assessment & Plan: Recommend continuing with current medication regimen.  He will be completing Plavix this week.  Recommend following up with neurology as scheduled   Wellness examination -     CBC with Differential/Platelet; Future -     Comprehensive metabolic  panel with GFR; Future -     Hemoglobin A1c; Future -     Lipid panel; Future -     TSH Rfx on Abnormal to Free T4; Future  Hyperlipidemia, unspecified hyperlipidemia type Assessment & Plan: Can continue with current medication regimen, no side effects noted.  We will plan to check lipid panel before next appointment for physical   Other orders -     Losartan Potassium; Take 1 tablet (25 mg total) by mouth daily.  Dispense: 90 tablet; Refill: 1 -     Atorvastatin Calcium; Take 1 tablet (80 mg total) by mouth every evening.  Dispense: 90 tablet; Refill: 1  Return in about 2 months (around 11/29/2024) for CPE with fasting labs 1 week prior.   ___________________________________________ Chelsye Suhre [name], MD, ABFM, CAQSM [facility]

## 2024-09-29 NOTE — Assessment & Plan Note (Signed)
 Blood pressure controlled in office today. Can continue with current medication regimen.  Recommend intermittent monitoring of blood pressure at home, DASH diet

## 2024-09-29 NOTE — Assessment & Plan Note (Signed)
 Can continue with current medication regimen, no side effects noted.  We will plan to check lipid panel before next appointment for physical

## 2024-10-20 NOTE — Progress Notes (Unsigned)
 [facility] [address] [place]. [place] [address] [phone]       HOSPITAL FOLLOW UP NOTE  [name] Date of Birth: 21-Dec-1960 Medical Record Number: [id number]   Reason for Referral:  hospital stroke follow up    SUBJECTIVE:   CHIEF COMPLAINT:  No chief complaint on file.   HPI:   Steven Bender is a 63 y.o. who  has a past medical history of Abnormal EKG, Loss of consciousness (HCC) (02/19/2017), and Syncope and collapse.  Patient presented on 09/10/2024 with sudden left sided weakness and dizziness. CT head negative. CTA unremarkable. 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 Echo obtained with normal EF, 55 to 60%. A1C 5.3. LDL 173. He was started on asa and Plavix  x 3 weeks then plans for asa alone. Atorvastatin  80mg  and losartan  started. NO outpatient therapy advised. He was discharged home 09/11/2024. Personally reviewed hospitalization pertinent progress notes, lab work and imaging.  Evaluated by Dr Jerri.   Since discharge,   BP  He continues atorvastatin  and asa.   THC?   PERTINENT IMAGING/LABS  CT no acute abnormality CT head and neck showed intracranial and extracranial atherosclerosis without significant stenosis MRI right PLIC infarct 2D Echo EF 55 to 60%, no PFO   A1C Lab Results  Component Value Date   HGBA1C 5.3 09/10/2024    Lipid Panel     Component Value Date/Time   CHOL 247 (H) 09/10/2024 1023   CHOL 242 (H) 06/19/2022 0917   TRIG 119 09/10/2024 1023   HDL 50 09/10/2024 1023   HDL 47 06/19/2022 0917   CHOLHDL 4.9 09/10/2024 1023   VLDL 24 09/10/2024 1023   LDLCALC 173 (H) 09/10/2024 1023   LDLCALC 173 (H) 06/19/2022 0917   LABVLDL 22 06/19/2022 0917      ROS:   14 system review of systems performed and negative with exception of those listed in HPI  PMH:  Past Medical History:  Diagnosis Date   Abnormal EKG    Loss of consciousness (HCC)  02/19/2017   Syncope and collapse    02/19/2017    PSH:  Past Surgical History:  Procedure Laterality Date   ADENOIDECTOMY      Social History:  Social History   Socioeconomic History   Marital status: Married    Spouse name: Not on file   Number of children: Not on file   Years of education: Not on file   Highest education level: Not on file  Occupational History   Not on file  Tobacco Use   Smoking status: Never   Smokeless tobacco: Never  Substance and Sexual Activity   Alcohol use: Yes    Alcohol/week: 4.0 standard drinks of alcohol    Types: 4 Cans of beer per week   Drug use: Yes    Types: Marijuana   Sexual activity: Not on file  Other Topics Concern   Not on file  Social History Narrative   Not on file   Social Drivers of Health   Financial Resource Strain: Not on file  Food Insecurity: No Food Insecurity (09/10/2024)   Hunger Vital Sign    Worried About Running Out of Food in the Last Year: Never true    Ran Out of Food in the Last Year: Never true  Transportation Needs: No Transportation Needs (09/10/2024)   PRAPARE - Administrator, Civil Service (Medical): No    Lack of Transportation (Non-Medical):  No  Physical Activity: Not on file  Stress: Not on file  Social Connections: Not on file  Intimate Partner Violence: Not At Risk (09/10/2024)   Humiliation, Afraid, Rape, and Kick questionnaire    Fear of Current or Ex-Partner: No    Emotionally Abused: No    Physically Abused: No    Sexually Abused: No    Family History:  Family History  Problem Relation Age of Onset   Heart disease Father        pt cannot recall exact details   Hyperlipidemia Mother    Stroke Mother    Depression Brother    Heart disease Brother    Diabetes Brother    Diabetes Sister    Diabetes Brother     Medications:   Current Outpatient Medications on File Prior to Visit  Medication Sig Dispense Refill   aspirin  EC 81 MG tablet Take 1 tablet (81 mg  total) by mouth daily. Swallow whole.     atorvastatin  (LIPITOR) 80 MG tablet Take 1 tablet (80 mg total) by mouth every evening. 90 tablet 1   losartan  (COZAAR ) 25 MG tablet Take 1 tablet (25 mg total) by mouth daily. 90 tablet 1   Zoster Vaccine Adjuvanted (SHINGRIX ) injection Inject 0.5 mLs into the muscle. 0.5 mL 0   No current facility-administered medications on file prior to visit.    Allergies:   Allergies  Allergen Reactions   Bovine (Beef) Protein-Containing Drug Products Nausea And Vomiting      OBJECTIVE:  Physical Exam  There were no vitals filed for this visit. There is no height or weight on file to calculate BMI. No results found.     12/16/2022   10:03 AM  Depression screen PHQ 2/9  Decreased Interest 0  Down, Depressed, Hopeless 0  PHQ - 2 Score 0  Altered sleeping 0  Tired, decreased energy 0  Change in appetite 0  Feeling bad or failure about yourself  0  Trouble concentrating 0  Moving slowly or fidgety/restless 0  Suicidal thoughts 0  PHQ-9 Score 0   Difficult doing work/chores Not difficult at all     Data saved with a previous flowsheet row definition     General: well developed, well nourished, seated, in no evident distress Head: head normocephalic and atraumatic.   Neck: supple with no carotid or supraclavicular bruits Cardiovascular: regular rate and rhythm, no murmurs Musculoskeletal: no deformity Skin:  no rash/petichiae Vascular:  Normal pulses all extremities   Neurologic Exam Mental Status: Awake and fully alert.  Fluent speech and language.  Oriented to place and time. Recent and remote memory intact. Attention span, concentration and fund of knowledge appropriate. Mood and affect appropriate.  Cranial Nerves: Fundoscopic exam reveals sharp disc margins. Pupils equal, briskly reactive to light. Extraocular movements full without nystagmus. Visual fields full to confrontation. Hearing intact. Facial sensation intact. Face, tongue,  palate moves normally and symmetrically.  Motor: Normal bulk and tone. Normal strength in all tested extremity muscles Sensory.: intact to touch , pinprick , position and vibratory sensation.  Coordination: Rapid alternating movements normal in all extremities. Finger-to-nose and heel-to-shin performed accurately bilaterally. Gait and Station: Arises from chair without difficulty. Stance is normal. Gait demonstrates normal stride length and balance with ***. Tandem walk and heel toe ***.  Reflexes: 1+ and symmetric.    NIHSS  *** Modified Rankin  ***    ASSESSMENT: Steven Bender is a 63 y.o. year old male presenting with sudden onset  left sided weakness and dizziness. Vascular risk factors include HTN, HLD.      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 Continue aspirin  81 mg daily and atorvastatin  80mg  daily for secondary stroke prevention. Discussed secondary stroke prevention measures and importance of close PCP follow up for aggressive stroke risk factor management. I have gone over the pathophysiology of stroke, warning signs and symptoms, risk factors and their management in some detail with instructions to go to the closest emergency room for symptoms of concern. HTN: BP goal <130/90.  Stable on losartan  per PCP HLD: LDL goal <70. Recent LDL 173. Continue atorvastatin  80mg  daily per PCP. SABRA  DMII: A1c goal<7.0. Recent A1c 5.3. Not diabetic. Continue well balanced diet and close follow up with PCP.   THC use: advised cessation   Follow up in *** or call earlier if needed   CC:  [facility] provider: Dr. Rosemarie PCP: [name], [name] PARAS, MD    I spent *** minutes of face-to-face and non-face-to-face time with patient.  This included previsit chart review including review of recent hospitalization, lab review, study review, order entry, electronic health record documentation, patient education regarding recent stroke  including etiology, secondary stroke prevention measures and importance of managing stroke risk factors, residual deficits and typical recovery time and answered all other questions to patient satisfaction   Greig Forbes, [facility]  [facility] [address] [place], [place] [address]  Phone [phone] Fax [phone] Note: This document was prepared with digital dictation and possible smart phrase technology. Any transcriptional errors that result from this process are unintentional.

## 2024-10-20 NOTE — Patient Instructions (Signed)

## 2024-10-21 ENCOUNTER — Other Ambulatory Visit (HOSPITAL_BASED_OUTPATIENT_CLINIC_OR_DEPARTMENT_OTHER): Payer: Self-pay | Admitting: *Deleted

## 2024-10-21 ENCOUNTER — Ambulatory Visit: Admitting: Family Medicine

## 2024-10-21 ENCOUNTER — Encounter: Payer: Self-pay | Admitting: Family Medicine

## 2024-10-21 VITALS — BP 143/86 | HR 70 | Ht 70.0 in | Wt 197.0 lb

## 2024-10-21 DIAGNOSIS — I639 Cerebral infarction, unspecified: Secondary | ICD-10-CM | POA: Diagnosis not present

## 2024-10-21 DIAGNOSIS — Z0000 Encounter for general adult medical examination without abnormal findings: Secondary | ICD-10-CM

## 2024-10-22 ENCOUNTER — Encounter (HOSPITAL_BASED_OUTPATIENT_CLINIC_OR_DEPARTMENT_OTHER): Admitting: Family Medicine

## 2024-10-22 LAB — CBC WITH DIFFERENTIAL/PLATELET
Basophils Absolute: 0.1 x10E3/uL (ref 0.0–0.2)
Basos: 1 %
EOS (ABSOLUTE): 0.2 x10E3/uL (ref 0.0–0.4)
Eos: 3 %
Hematocrit: 41.5 % (ref 37.5–51.0)
Hemoglobin: 13.9 g/dL (ref 13.0–17.7)
Immature Grans (Abs): 0 x10E3/uL (ref 0.0–0.1)
Immature Granulocytes: 0 %
Lymphocytes Absolute: 1.7 x10E3/uL (ref 0.7–3.1)
Lymphs: 23 %
MCH: 30.5 pg (ref 26.6–33.0)
MCHC: 33.5 g/dL (ref 31.5–35.7)
MCV: 91 fL (ref 79–97)
Monocytes Absolute: 0.7 x10E3/uL (ref 0.1–0.9)
Monocytes: 10 %
Neutrophils Absolute: 4.8 x10E3/uL (ref 1.4–7.0)
Neutrophils: 63 %
Platelets: 233 x10E3/uL (ref 150–450)
RBC: 4.55 x10E6/uL (ref 4.14–5.80)
RDW: 11.9 % (ref 11.6–15.4)
WBC: 7.4 x10E3/uL (ref 3.4–10.8)

## 2024-10-22 LAB — LIPID PANEL
Chol/HDL Ratio: 3.4 ratio (ref 0.0–5.0)
Cholesterol, Total: 115 mg/dL (ref 100–199)
HDL: 34 mg/dL — ABNORMAL LOW (ref >39)
LDL Chol Calc (NIH): 49 mg/dL (ref 0–99)
Triglycerides: 195 mg/dL — ABNORMAL HIGH (ref 0–149)
VLDL Cholesterol Cal: 32 mg/dL (ref 5–40)

## 2024-10-22 LAB — COMPREHENSIVE METABOLIC PANEL WITH GFR
ALT: 27 IU/L (ref 0–44)
AST: 32 IU/L (ref 0–40)
Albumin: 4.5 g/dL (ref 3.9–4.9)
Alkaline Phosphatase: 118 IU/L (ref 47–123)
BUN/Creatinine Ratio: 17 (ref 10–24)
BUN: 17 mg/dL (ref 8–27)
Bilirubin Total: 0.8 mg/dL (ref 0.0–1.2)
CO2: 23 mmol/L (ref 20–29)
Calcium: 9.9 mg/dL (ref 8.6–10.2)
Chloride: 100 mmol/L (ref 96–106)
Creatinine, Ser: 0.99 mg/dL (ref 0.76–1.27)
Globulin, Total: 2.5 g/dL (ref 1.5–4.5)
Glucose: 99 mg/dL (ref 70–99)
Potassium: 5 mmol/L (ref 3.5–5.2)
Sodium: 139 mmol/L (ref 134–144)
Total Protein: 7 g/dL (ref 6.0–8.5)
eGFR: 86 mL/min/1.73 (ref >59)

## 2024-10-22 LAB — TSH RFX ON ABNORMAL TO FREE T4: TSH: 1.06 u[IU]/mL (ref 0.450–4.500)

## 2024-10-22 LAB — HEMOGLOBIN A1C
Est. average glucose Bld gHb Est-mCnc: 117 mg/dL
Hgb A1c MFr Bld: 5.7 % — ABNORMAL HIGH (ref 4.8–5.6)

## 2024-10-28 ENCOUNTER — Ambulatory Visit (INDEPENDENT_AMBULATORY_CARE_PROVIDER_SITE_OTHER): Admitting: Family Medicine

## 2024-10-28 ENCOUNTER — Other Ambulatory Visit (HOSPITAL_BASED_OUTPATIENT_CLINIC_OR_DEPARTMENT_OTHER): Payer: Self-pay

## 2024-10-28 ENCOUNTER — Encounter (HOSPITAL_BASED_OUTPATIENT_CLINIC_OR_DEPARTMENT_OTHER): Payer: Self-pay | Admitting: Family Medicine

## 2024-10-28 VITALS — BP 131/65 | HR 68 | Temp 97.7°F | Resp 18 | Ht 70.0 in | Wt 203.0 lb

## 2024-10-28 DIAGNOSIS — Z0000 Encounter for general adult medical examination without abnormal findings: Secondary | ICD-10-CM | POA: Insufficient documentation

## 2024-10-28 MED ORDER — COMIRNATY 30 MCG/0.3ML IM SUSY
0.3000 mL | PREFILLED_SYRINGE | Freq: Once | INTRAMUSCULAR | 0 refills | Status: AC
  Filled 2024-10-28: qty 0.3, 1d supply, fill #0

## 2024-10-28 NOTE — Assessment & Plan Note (Signed)
 Routine HCM labs reviewed. HCM reviewed/discussed. Anticipatory guidance regarding healthy weight, lifestyle and choices given. Recommend healthy diet.  Recommend approximately 150 minutes/week of moderate intensity exercise Recommend regular dental and vision exams Always use seatbelt/lap and shoulder restraints Recommend using smoke alarms and checking batteries at least twice a year Recommend using sunscreen when outside Discussed colon cancer screening recommendations, options.  Patient UTD - Cologuard in 2023 Discussed immunization recommendations

## 2024-10-28 NOTE — Progress Notes (Signed)
 Subjective:    CC: Annual Physical Exam  HPI: Steven Bender is a 63 y.o. presenting for annual physical  I reviewed the past medical history, family history, social history, surgical history, and allergies today and no changes were needed.  Please see the problem list section below in epic for further details.  Past Medical History: Past Medical History:  Diagnosis Date   Abnormal EKG    Loss of consciousness (HCC) 02/19/2017   Syncope and collapse    02/19/2017   Past Surgical History: Past Surgical History:  Procedure Laterality Date   ADENOIDECTOMY     Social History: Social History   Socioeconomic History   Marital status: Married    Spouse name: Not on file   Number of children: Not on file   Years of education: Not on file   Highest education level: Not on file  Occupational History   Not on file  Tobacco Use   Smoking status: Never    Passive exposure: Never   Smokeless tobacco: Never  Substance and Sexual Activity   Alcohol use: Yes    Alcohol/week: 4.0 standard drinks of alcohol    Types: 4 Cans of beer per week   Drug use: Yes    Types: Marijuana   Sexual activity: Not on file  Other Topics Concern   Not on file  Social History Narrative   Not on file   Social Drivers of Health   Tobacco Use: Low Risk (10/28/2024)   Patient History    Smoking Tobacco Use: Never    Smokeless Tobacco Use: Never    Passive Exposure: Never  Financial Resource Strain: Not on file  Food Insecurity: No Food Insecurity (09/10/2024)   Epic    Worried About Programme Researcher, Broadcasting/film/video in the Last Year: Never true    Ran Out of Food in the Last Year: Never true  Transportation Needs: No Transportation Needs (09/10/2024)   Epic    Lack of Transportation (Medical): No    Lack of Transportation (Non-Medical): No  Physical Activity: Not on file  Stress: Not on file  Social Connections: Not on file  Depression (PHQ2-9): Low Risk (10/28/2024)   Depression (PHQ2-9)    PHQ-2  Score: 0  Alcohol Screen: Not on file  Housing: Low Risk (09/10/2024)   Epic    Unable to Pay for Housing in the Last Year: No    Number of Times Moved in the Last Year: 0    Homeless in the Last Year: No  Utilities: Not At Risk (09/10/2024)   Epic    Threatened with loss of utilities: No  Health Literacy: Not on file   Family History: Family History  Problem Relation Age of Onset   Heart disease Father        pt cannot recall exact details   Hyperlipidemia Mother    Stroke Mother    Depression Brother    Heart disease Brother    Diabetes Brother    Diabetes Sister    Diabetes Brother    Allergies: Allergies[1] Medications: See med rec.  Review of Systems: No headache, visual changes, nausea, vomiting, diarrhea, constipation, dizziness, abdominal pain, skin rash, fevers, chills, night sweats, swollen lymph nodes, weight loss, chest pain, body aches, joint swelling, muscle aches, shortness of breath, mood changes, visual or auditory hallucinations.  Objective:    BP 131/65 (BP Location: Left Arm, Patient Position: Sitting, Cuff Size: Normal)   Pulse 68   Temp 97.7 F (36.5 C) (Oral)  Resp 18   Ht 5' 10 (1.778 m)   Wt 203 lb (92.1 kg)   SpO2 100%   BMI 29.13 kg/m   General: Well Developed, well nourished, and in no acute distress.  Neuro: Alert and oriented x3, extra-ocular muscles intact, sensation grossly intact. Cranial nerves II through XII are intact, motor, sensory, and coordinative functions are all intact. HEENT: Normocephalic, atraumatic, pupils equal round reactive to light, neck supple, no masses, no lymphadenopathy, thyroid nonpalpable. Oropharynx, nasopharynx, external ear canals are unremarkable. Skin: Warm and dry, no rashes noted.  Cardiac: Regular rate and rhythm, no murmurs rubs or gallops. Respiratory: Clear to auscultation bilaterally. Not using accessory muscles, speaking in full sentences. Abdominal: Soft, nontender, nondistended, positive bowel  sounds, no masses, no organomegaly. Musculoskeletal: Shoulder, elbow, wrist, hip, knee, ankle stable, and with full range of motion.  Impression and Recommendations:    Wellness examination Assessment & Plan: Routine HCM labs reviewed. HCM reviewed/discussed. Anticipatory guidance regarding healthy weight, lifestyle and choices given. Recommend healthy diet.  Recommend approximately 150 minutes/week of moderate intensity exercise Recommend regular dental and vision exams Always use seatbelt/lap and shoulder restraints Recommend using smoke alarms and checking batteries at least twice a year Recommend using sunscreen when outside Discussed colon cancer screening recommendations, options.  Patient UTD - Cologuard in 2023 Discussed immunization recommendations   Return in about 6 months (around 04/28/2025) for hypertension, hyperlipidemia, med check.   ___________________________________________ Jaquelin Meaney [name], MD, ABFM, CAQSM Primary Care and Sports Medicine [facility]    [1]  Allergies Allergen Reactions   Bovine (Beef) Protein-Containing Drug Products Nausea And Vomiting

## 2025-01-20 ENCOUNTER — Encounter (HOSPITAL_BASED_OUTPATIENT_CLINIC_OR_DEPARTMENT_OTHER): Admitting: Family Medicine

## 2025-04-28 ENCOUNTER — Ambulatory Visit (HOSPITAL_BASED_OUTPATIENT_CLINIC_OR_DEPARTMENT_OTHER): Admitting: Family Medicine
